# Patient Record
Sex: Female | Born: 1935 | Race: White | Hispanic: No | Marital: Married | Smoking: Never smoker
Health system: Southern US, Community
[De-identification: ages and names within clinical notes are randomized; demographics above are authoritative.]

## PROBLEM LIST (undated history)

## (undated) DIAGNOSIS — IMO0002 Reserved for concepts with insufficient information to code with codable children: Secondary | ICD-10-CM

## (undated) DIAGNOSIS — I10 Essential (primary) hypertension: Secondary | ICD-10-CM

## (undated) DIAGNOSIS — K219 Gastro-esophageal reflux disease without esophagitis: Secondary | ICD-10-CM

## (undated) DIAGNOSIS — I639 Cerebral infarction, unspecified: Secondary | ICD-10-CM

## (undated) DIAGNOSIS — M329 Systemic lupus erythematosus, unspecified: Secondary | ICD-10-CM

## (undated) DIAGNOSIS — G459 Transient cerebral ischemic attack, unspecified: Secondary | ICD-10-CM

## (undated) HISTORY — PX: ESOPHAGOGASTRODUODENOSCOPY: SHX1529

## (undated) HISTORY — PX: APPENDECTOMY: SHX54

---

## 1998-01-29 ENCOUNTER — Other Ambulatory Visit: Admission: RE | Admit: 1998-01-29 | Discharge: 1998-01-29 | Payer: Self-pay | Admitting: Family Medicine

## 1999-03-05 ENCOUNTER — Other Ambulatory Visit: Admission: RE | Admit: 1999-03-05 | Discharge: 1999-03-05 | Payer: Self-pay | Admitting: Family Medicine

## 1999-06-19 ENCOUNTER — Encounter: Payer: Self-pay | Admitting: Family Medicine

## 1999-06-19 ENCOUNTER — Encounter: Admission: RE | Admit: 1999-06-19 | Discharge: 1999-06-19 | Payer: Self-pay | Admitting: Family Medicine

## 2000-06-21 ENCOUNTER — Encounter: Admission: RE | Admit: 2000-06-21 | Discharge: 2000-06-21 | Payer: Self-pay | Admitting: Family Medicine

## 2000-06-21 ENCOUNTER — Encounter: Payer: Self-pay | Admitting: Family Medicine

## 2011-08-30 ENCOUNTER — Encounter (HOSPITAL_BASED_OUTPATIENT_CLINIC_OR_DEPARTMENT_OTHER): Payer: Self-pay

## 2011-08-30 ENCOUNTER — Emergency Department (HOSPITAL_BASED_OUTPATIENT_CLINIC_OR_DEPARTMENT_OTHER)
Admission: EM | Admit: 2011-08-30 | Discharge: 2011-08-30 | Disposition: A | Discharge status: Home / Self Care | Payer: Medicare Other | Attending: Emergency Medicine | Admitting: Emergency Medicine

## 2011-08-30 DIAGNOSIS — M549 Dorsalgia, unspecified: Secondary | ICD-10-CM | POA: Insufficient documentation

## 2011-08-30 DIAGNOSIS — K219 Gastro-esophageal reflux disease without esophagitis: Secondary | ICD-10-CM | POA: Insufficient documentation

## 2011-08-30 DIAGNOSIS — I10 Essential (primary) hypertension: Secondary | ICD-10-CM | POA: Insufficient documentation

## 2011-08-30 DIAGNOSIS — Z79899 Other long term (current) drug therapy: Secondary | ICD-10-CM | POA: Insufficient documentation

## 2011-08-30 HISTORY — DX: Essential (primary) hypertension: I10

## 2011-08-30 HISTORY — DX: Gastro-esophageal reflux disease without esophagitis: K21.9

## 2011-08-30 MED ORDER — ONDANSETRON HCL 4 MG/2ML IJ SOLN
4.0000 mg | Freq: Once | INTRAMUSCULAR | Status: AC
  Administered 2011-08-30: 4 mg via INTRAMUSCULAR
  Filled 2011-08-30: qty 2

## 2011-08-30 MED ORDER — LORAZEPAM 2 MG/ML IJ SOLN
1.0000 mg | Freq: Once | INTRAMUSCULAR | Status: AC
  Administered 2011-08-30: 1 mg via INTRAMUSCULAR
  Filled 2011-08-30: qty 1

## 2011-08-30 MED ORDER — OXYCODONE-ACETAMINOPHEN 5-325 MG PO TABS
2.0000 | ORAL_TABLET | Freq: Once | ORAL | Status: AC
  Administered 2011-08-30: 2 via ORAL
  Filled 2011-08-30: qty 2

## 2011-08-30 MED ORDER — KETOROLAC TROMETHAMINE 60 MG/2ML IM SOLN
60.0000 mg | Freq: Once | INTRAMUSCULAR | Status: AC
  Administered 2011-08-30: 60 mg via INTRAMUSCULAR
  Filled 2011-08-30: qty 2

## 2011-08-30 MED ORDER — HYDROMORPHONE HCL PF 2 MG/ML IJ SOLN
2.0000 mg | Freq: Once | INTRAMUSCULAR | Status: AC
  Administered 2011-08-30: 2 mg via INTRAMUSCULAR
  Filled 2011-08-30: qty 1

## 2011-08-30 MED ORDER — OXYCODONE-ACETAMINOPHEN 5-325 MG PO TABS: 2.0000 | ORAL_TABLET | ORAL | Status: AC | PRN

## 2011-08-30 NOTE — ED Provider Notes (Signed)
History     CSN: [id number]  Arrival date & time 08/30/11  1419   First MD Initiated Contact with Patient 08/30/11 1607      Chief Complaint  Patient presents with  . Tailbone Pain    (Consider location/radiation/quality/duration/timing/severity/associated sxs/prior treatment) Patient is a 77 y.o. female presenting with back pain. The history is provided by the patient. No language interpreter was used.  Back Pain  This is a new problem. The problem occurs constantly. The problem has been gradually worsening. The pain is associated with no known injury. The pain is present in the sacro-iliac joint. The pain is at a severity of 8/10. The pain is moderate. The pain is the same all the time. Stiffness is present all day. She has tried nothing for the symptoms. The treatment provided no relief.  Pt complains of severe pain to tail bone area.  Pt is on nucynta for pain.  Pt recently had a cortisone injection.  Pt has had an MRI in the past. Pt is being seen by neurologist at [facility].  Past Medical History  Diagnosis Date  . Hypertension   . GERD (gastroesophageal reflux disease)     Past Surgical History  Procedure Date  . Appendectomy     History reviewed. No pertinent family history.  History  Substance Use Topics  . Smoking status: Never Smoker   . Smokeless tobacco: Not on file  . Alcohol Use: No    OB History    Grav Para Term Preterm Abortions TAB SAB Ect Mult Living                  Review of Systems  Musculoskeletal: Positive for back pain.  All other systems reviewed and are negative.    Allergies  Plaquenil  Home Medications   Current Outpatient Rx  Name Route Sig Dispense Refill  . BUTALBITAL-APAP-CAFFEINE 50-325-40 MG PO TABS Oral Take 1 tablet by mouth 2 (two) times daily as needed. For headache    . CALCIUM + D PO Oral Take 1 tablet by mouth daily.    Marland Kitchen DICYCLOMINE HCL 20 MG PO TABS Oral Take 20 mg by mouth 4 (four) times daily -   before meals and at bedtime.     Marland Kitchen DIFLUPREDNATE 0.05 % OP EMUL Left Eye Place 1 drop into the left eye 3 (three) times daily.    Marland Kitchen LISINOPRIL 20 MG PO TABS Oral Take 20 mg by mouth daily.    . ADULT MULTIVITAMIN W/MINERALS CH Oral Take 1 tablet by mouth daily.    . OXYCODONE-ACETAMINOPHEN 7.5-325 MG PO TABS Oral Take 1 tablet by mouth every 4 (four) hours as needed. For pain    . PANTOPRAZOLE SODIUM 40 MG PO TBEC Oral Take 40 mg by mouth daily.    Marland Kitchen PROMETHAZINE HCL 25 MG PO TABS Oral Take 25 mg by mouth every 6 (six) hours as needed. For nausea    . SUCRALFATE 1 G PO TABS Oral Take 1 g by mouth at bedtime.     Marland Kitchen TAPENTADOL HCL 75 MG PO TABS Oral Take 2 tablets by mouth every 4 (four) hours as needed. For pain    . ZOLPIDEM TARTRATE 10 MG PO TABS Oral Take 10 mg by mouth at bedtime as needed. For sleep      BP 163/85  Pulse 74  Temp(Src) 97.5 F (36.4 C) (Oral)  Resp 21  Ht 5\' 4"  (1.626 m)  Wt 118 lb (53.524 kg)  BMI  20.25 kg/m2  SpO2 99%  Physical Exam  Nursing note and vitals reviewed. Constitutional: She is oriented to person, place, and time. She appears well-developed and well-nourished.  HENT:  Head: Normocephalic and atraumatic.  Eyes: Conjunctivae are normal. Pupils are equal, round, and reactive to light.  Neck: Normal range of motion. Neck supple.  Pulmonary/Chest: Effort normal.  Abdominal: Soft.  Musculoskeletal: Normal range of motion.  Neurological: She is alert and oriented to person, place, and time. She has normal reflexes.  Skin: Skin is warm.  Psychiatric: She has a normal mood and affect.    ED Course  Procedures (including critical care time)  Labs Reviewed - No data to display No results found.   No diagnosis found.    MDM  Dr. Hyman Hopes in to see and examine.  Pt reports minimal relief with dilaudid.   I gave 2nd injection of ativan and torodol   No relief with second injection.   I offered pt option of admission.  Pt wants to go home.  I gave pt  2 percocet.  Pt reports improved pain/   I advised her to see her Md tomorrow for recheck    Langston Masker, [place] 08/30/11 Lenord Fellers [place], [place] 08/30/11 2127

## 2011-08-30 NOTE — ED Notes (Signed)
Pt states that she had onset of sacral pain last night which became unbearable and she is visibly uncomfortable at triage.  Cannot find a position she is not in pain.  Pt states that

## 2011-08-31 NOTE — ED Provider Notes (Signed)
Medical screening examination/treatment/procedure(s) were conducted as a shared visit with non-physician practitioner(s) and myself.  I personally evaluated the patient during the encounter   Forbes Cellar, MD 08/31/11 [phone]

## 2013-07-07 ENCOUNTER — Emergency Department (HOSPITAL_COMMUNITY): Payer: Medicare Other

## 2013-07-07 ENCOUNTER — Inpatient Hospital Stay (HOSPITAL_COMMUNITY)
Admission: EM | Admit: 2013-07-07 | Discharge: 2013-07-10 | DRG: 066 | Disposition: A | Discharge status: Home / Self Care | Payer: Medicare Other | Attending: Internal Medicine | Admitting: Internal Medicine

## 2013-07-07 ENCOUNTER — Encounter (HOSPITAL_COMMUNITY): Payer: Self-pay | Admitting: Emergency Medicine

## 2013-07-07 DIAGNOSIS — R4789 Other speech disturbances: Secondary | ICD-10-CM | POA: Diagnosis present

## 2013-07-07 DIAGNOSIS — I639 Cerebral infarction, unspecified: Secondary | ICD-10-CM

## 2013-07-07 DIAGNOSIS — G894 Chronic pain syndrome: Secondary | ICD-10-CM | POA: Diagnosis present

## 2013-07-07 DIAGNOSIS — M549 Dorsalgia, unspecified: Secondary | ICD-10-CM | POA: Diagnosis present

## 2013-07-07 DIAGNOSIS — R4781 Slurred speech: Secondary | ICD-10-CM

## 2013-07-07 DIAGNOSIS — W19XXXD Unspecified fall, subsequent encounter: Secondary | ICD-10-CM

## 2013-07-07 DIAGNOSIS — G459 Transient cerebral ischemic attack, unspecified: Secondary | ICD-10-CM | POA: Insufficient documentation

## 2013-07-07 DIAGNOSIS — E876 Hypokalemia: Secondary | ICD-10-CM

## 2013-07-07 DIAGNOSIS — Z7982 Long term (current) use of aspirin: Secondary | ICD-10-CM

## 2013-07-07 DIAGNOSIS — K219 Gastro-esophageal reflux disease without esophagitis: Secondary | ICD-10-CM | POA: Diagnosis present

## 2013-07-07 DIAGNOSIS — Z9181 History of falling: Secondary | ICD-10-CM

## 2013-07-07 DIAGNOSIS — I6350 Cerebral infarction due to unspecified occlusion or stenosis of unspecified cerebral artery: Principal | ICD-10-CM | POA: Diagnosis present

## 2013-07-07 DIAGNOSIS — W19XXXA Unspecified fall, initial encounter: Secondary | ICD-10-CM

## 2013-07-07 DIAGNOSIS — I10 Essential (primary) hypertension: Secondary | ICD-10-CM

## 2013-07-07 DIAGNOSIS — S0990XA Unspecified injury of head, initial encounter: Secondary | ICD-10-CM

## 2013-07-07 LAB — CBC
HCT: 33.8 % — ABNORMAL LOW (ref 36.0–46.0)
Hemoglobin: 11.1 g/dL — ABNORMAL LOW (ref 12.0–15.0)
MCH: 30.7 pg (ref 26.0–34.0)
MCHC: 32.8 g/dL (ref 30.0–36.0)
MCV: 93.6 fL (ref 78.0–100.0)

## 2013-07-07 LAB — URINALYSIS, ROUTINE W REFLEX MICROSCOPIC
Ketones, ur: NEGATIVE mg/dL
Leukocytes, UA: NEGATIVE
Nitrite: NEGATIVE
Protein, ur: NEGATIVE mg/dL
pH: 7 (ref 5.0–8.0)

## 2013-07-07 LAB — COMPREHENSIVE METABOLIC PANEL
Alkaline Phosphatase: 48 U/L (ref 39–117)
BUN: 14 mg/dL (ref 6–23)
Creatinine, Ser: 0.8 mg/dL (ref 0.50–1.10)
GFR calc Af Amer: 81 mL/min — ABNORMAL LOW (ref >90)
Glucose, Bld: 93 mg/dL (ref 70–99)
Potassium: 3.6 mEq/L (ref 3.5–5.1)
Total Protein: 6 g/dL (ref 6.0–8.3)

## 2013-07-07 LAB — PROTIME-INR: INR: 1.01 (ref 0.00–1.49)

## 2013-07-07 LAB — URINE MICROSCOPIC-ADD ON

## 2013-07-07 MED ORDER — BUTALBITAL-APAP-CAFFEINE 50-325-40 MG PO TABS
2.0000 | ORAL_TABLET | Freq: Four times a day (QID) | ORAL | Status: DC | PRN
  Administered 2013-07-07 – 2013-07-10 (×7): 2 via ORAL
  Filled 2013-07-07 (×7): qty 2

## 2013-07-07 MED ORDER — ENOXAPARIN SODIUM 40 MG/0.4ML ~~LOC~~ SOLN
40.0000 mg | SUBCUTANEOUS | Status: DC
  Administered 2013-07-08 – 2013-07-09 (×2): 40 mg via SUBCUTANEOUS
  Filled 2013-07-07 (×3): qty 0.4

## 2013-07-07 MED ORDER — FAMOTIDINE 40 MG PO TABS
40.0000 mg | ORAL_TABLET | Freq: Three times a day (TID) | ORAL | Status: DC
  Administered 2013-07-07 – 2013-07-10 (×9): 40 mg via ORAL
  Filled 2013-07-07 (×10): qty 1

## 2013-07-07 MED ORDER — OXYCODONE-ACETAMINOPHEN 7.5-325 MG PO TABS: 1.0000 | ORAL_TABLET | Freq: Three times a day (TID) | ORAL | Status: DC | PRN

## 2013-07-07 MED ORDER — OXYCODONE HCL 5 MG PO TABS: 2.5000 mg | ORAL_TABLET | Freq: Three times a day (TID) | ORAL | Status: DC | PRN

## 2013-07-07 MED ORDER — ZOLPIDEM TARTRATE 5 MG PO TABS
5.0000 mg | ORAL_TABLET | Freq: Every evening | ORAL | Status: DC | PRN
  Administered 2013-07-07 – 2013-07-09 (×3): 5 mg via ORAL
  Filled 2013-07-07 (×3): qty 1

## 2013-07-07 MED ORDER — OXYCODONE-ACETAMINOPHEN 5-325 MG PO TABS: 1.0000 | ORAL_TABLET | Freq: Three times a day (TID) | ORAL | Status: DC | PRN

## 2013-07-07 MED ORDER — PANTOPRAZOLE SODIUM 40 MG PO TBEC
40.0000 mg | DELAYED_RELEASE_TABLET | Freq: Every day | ORAL | Status: DC
  Administered 2013-07-08 – 2013-07-10 (×3): 40 mg via ORAL
  Filled 2013-07-07 (×2): qty 1

## 2013-07-07 MED ORDER — ASPIRIN 325 MG PO TABS
325.0000 mg | ORAL_TABLET | Freq: Every day | ORAL | Status: DC
  Administered 2013-07-08: 325 mg via ORAL
  Filled 2013-07-07 (×2): qty 1

## 2013-07-07 MED ORDER — LISINOPRIL 20 MG PO TABS
20.0000 mg | ORAL_TABLET | Freq: Every day | ORAL | Status: DC
  Administered 2013-07-08 – 2013-07-10 (×3): 20 mg via ORAL
  Filled 2013-07-07 (×3): qty 1

## 2013-07-07 NOTE — ED Provider Notes (Signed)
TIME SEEN: 7:00 PM  CHIEF COMPLAINT: Fall, head injury, slurred speech and unsteady gait  HPI: Patient is a 78 year old female with a history of hypertension, prior TIA who presents to the emergency department after a fall this morning. Patient reports that she was walking out of the driveway went to get the newspaper when she bent over and fell striking her head. She had no loss of consciousness. She was able to get up immediately back into the house. Her husband reports that she was drowsy for the rest of the day taking multiple naps. When he woke her up this afternoon at 4 PM he noticed that she had slurred speech, difficulty with word finding and then had unsteady gait. This has improved. She is denying any headache, neck pain. She denies any numbness or focal weakness. She's not on anticoagulation other than aspirin. She denies any chest pain, shortness breath, palpitations or dizziness that led to her fall. No recent vomiting or diarrhea. No fever or cough. No dysuria or hematuria.  ROS: See HPI Constitutional: no fever  Eyes: no drainage  ENT: no runny nose   Cardiovascular:  no chest pain  Resp: no SOB  GI: no vomiting GU: no dysuria Integumentary: no rash  Allergy: no hives  Musculoskeletal: no leg swelling  Neurological: no slurred speech ROS otherwise negative  PAST MEDICAL HISTORY/PAST SURGICAL HISTORY:  Past Medical History  Diagnosis Date  . Hypertension   . GERD (gastroesophageal reflux disease)     MEDICATIONS:  Prior to Admission medications   Medication Sig Start Date End Date Taking? Authorizing Provider  aspirin 325 MG tablet Take 325 mg by mouth daily.   Yes Historical Provider, MD  butalbital-acetaminophen-caffeine (FIORICET, ESGIC) 50-325-40 MG per tablet Take 2 tablets by mouth every 6 (six) hours as needed for headache. For headache   Yes Historical Provider, MD  famotidine (PEPCID) 40 MG tablet Take 40 mg by mouth 3 (three) times daily.   Yes Historical  Provider, MD  lisinopril (PRINIVIL,ZESTRIL) 20 MG tablet Take 20 mg by mouth daily.   Yes Historical Provider, MD  oxyCODONE-acetaminophen (PERCOCET) 7.5-325 MG per tablet Take 1 tablet by mouth every 6 (six) hours as needed for pain. For pain   Yes Historical Provider, MD    ALLERGIES:  Allergies  Allergen Reactions  . Plaquenil [Hydroxychloroquine Sulfate] Rash    SOCIAL HISTORY:  History  Substance Use Topics  . Smoking status: Never Smoker   . Smokeless tobacco: Not on file  . Alcohol Use: No    FAMILY HISTORY: No family history on file.  EXAM: BP 161/72  Temp(Src) 98 F (36.7 C) (Oral)  Resp 18  Ht 5\' 3"  (1.6 m)  Wt 118 lb (53.524 kg)  BMI 20.91 kg/m2  SpO2 98% CONSTITUTIONAL: Alert and oriented x3 and responds appropriately to questions. Well-appearing; well-nourished; GCS 15 HEAD: Normocephalic; atraumatic EYES: Conjunctivae clear, PERRL, EOMI ENT: normal nose; no rhinorrhea; moist mucous membranes; pharynx without lesions noted; no dental injury; no septal hematoma NECK: Supple, no meningismus, no LAD; no midline spinal tenderness, step-off or deformity CARD: RRR; S1 and S2 appreciated; no murmurs, no clicks, no rubs, no gallops RESP: Normal chest excursion without splinting or tachypnea; breath sounds clear and equal bilaterally; no wheezes, no rhonchi, no rales; chest wall stable, nontender to palpation ABD/GI: Normal bowel sounds; non-distended; soft, non-tender, no rebound, no guarding PELVIS:  stable, nontender to palpation; full range of motion in bilateral hips with no pain with internal or external rotation,  no leg length discrepancy BACK:  The back appears normal and is non-tender to palpation, there is no CVA tenderness; no midline spinal tenderness, step-off or deformity EXT: Normal ROM in all joints; non-tender to palpation; no edema; normal capillary refill; no cyanosis    SKIN: Normal color for age and race; warm NEURO: Moves all extremities equally; ,  strength 5/5 in all 4 extremity, cranial nerves II through XII intact, sensation to light touch intact diffusely, no pronator drift PSYCH: The patient's mood and manner are appropriate. Grooming and personal hygiene are appropriate.  MEDICAL DECISION MAKING: Patient here with possible mechanical fall with head injury. She had some neurologic deficits have improved. Concern for possible intracranial hemorrhage versus TIA versus concussion. Will check labs, EKG, CT head and cervical spine., Urinalysis. Have recommended admission given her prior history of TIA for observation. Patient and family agree with this plan. They report their physician is Dr. Lendon Colonel with [facility] in [facility].  ED PROGRESS: Patient's labs are unremarkable other than a mild leukocytosis. Urine does show trace hemoglobin but no other sign of infection. Head and neck CT showed no acute abnormality. Patient is still neurologically intact and hemodynamically stable. Discussed with hospitalist, Dr. Allena Katz, for admission to telemetry, observation for TIA workup.    EKG Interpretation    Date/Time:  Friday July 07 2013 18:28:36 EST Ventricular Rate:  61 PR Interval:  150 QRS Duration: 74 QT Interval:  422 QTC Calculation: 425 R Axis:   -7 Text Interpretation:  Sinus rhythm Abnormal R-wave progression, early transition No old tracing to compare Confirmed by Joandry Slagter  DO, Shlok Raz ([id number]) on 07/07/2013 7:25:53 PM             Kim Maw Yuli Lanigan, DO 07/07/13 2143

## 2013-07-07 NOTE — ED Notes (Signed)
Patient transported to X-ray 

## 2013-07-07 NOTE — H&P (Signed)
[facility] History and Physical  Patient: Kim Blevins  FAO:[id number]  DOB: Jan 09, 1935  DOS: the patient was seen and examined on 07/07/2013 PCP: Toniann Fail, MD  Chief Complaint: Fall  HPI: Kim Blevins is a 78 y.o. female with Past medical history of hypertension, GERD, chronic pain, recurrent fall. The patient is coming from home. The history is obtained from old patient as well as her husband. Today around 10 AM the patient was in house and she had a fall. It was not witnessed by her husband heard a thud and when he came to the living room he found her on the ground close to her chair. He was able to stand her up and she was not confused but after lunch she wanted to sleep and was not waking up until 4pm, as her husband woke her up. At that time she was having slurred speech, she was confused and not oriented to time or place. She was able to identify her husband. And therefore he brought her to the hospital. She has been having recurrent falls with her last fall that required admission to the hospital was in September. They have recently adjusted her medication lisinopril from 30 mg to 20 mg daily. The patient does not remember the events of the fall. She mentions that since she woke up in the morning she has been having grogginess. She denies any fever or chills or burning urination or diarrhea or constipation or nausea or vomiting. She has not taken Ambien yesterday.  Review of Systems: as mentioned in the history of present illness.  A Comprehensive review of the other systems is negative.  Past Medical History  Diagnosis Date  . Hypertension   . GERD (gastroesophageal reflux disease)    Past Surgical History  Procedure Laterality Date  . Appendectomy     Social History:  reports that she has never smoked. She does not have any smokeless tobacco history on file. She reports that she does not drink alcohol. Her drug history is not on file. Independent for most of her   ADL.  Allergies  Allergen Reactions  . Plaquenil [Hydroxychloroquine Sulfate] Rash    No family history on file.  Prior to Admission medications   Medication Sig Start Date End Date Taking? Authorizing Provider  aspirin 325 MG tablet Take 325 mg by mouth daily.   Yes Historical Provider, MD  butalbital-acetaminophen-caffeine (FIORICET, ESGIC) 50-325-40 MG per tablet Take 2 tablets by mouth every 6 (six) hours as needed for headache. For headache   Yes Historical Provider, MD  famotidine (PEPCID) 40 MG tablet Take 40 mg by mouth 3 (three) times daily.   Yes Historical Provider, MD  lisinopril (PRINIVIL,ZESTRIL) 20 MG tablet Take 20 mg by mouth daily.   Yes Historical Provider, MD  omeprazole (PRILOSEC) 20 MG capsule Take 20 mg by mouth daily.   Yes Historical Provider, MD  oxyCODONE-acetaminophen (PERCOCET) 7.5-325 MG per tablet Take 1 tablet by mouth every 6 (six) hours as needed for pain. For pain   Yes Historical Provider, MD  zolpidem (AMBIEN) 10 MG tablet Take 10 mg by mouth at bedtime as needed for sleep.   Yes Historical Provider, MD    Physical Exam: Filed Vitals:   07/07/13 1830 07/07/13 2019  BP: 161/72 163/66  Temp: 98 F (36.7 C)   TempSrc: Oral   Resp: 18 18  Height: 5\' 3"  (1.6 m)   Weight: 53.524 kg (118 lb)   SpO2: 98% 98%  General: Alert, Awake and Oriented to Time, Place and Person. Appear in mild distress Eyes: PERRL ENT: Oral Mucosa clear moist. Neck: no JVD Cardiovascular: S1 and S2 Present, no Murmur, Peripheral Pulses Present Respiratory: Bilateral Air entry equal and Decreased, Clear to Auscultation,  no Crackles,no wheezes Abdomen: Bowel Sound Present, Soft and Non tender Skin: no Rash Extremities: no Pedal edema, no calf tenderness Neurologic: Mental status alert awake and oriented to time person and person normal speech normal attention, Cranial Nerves pupils are reactive good cough, Motor strength bilaterally), Sensation intact to light touch,  reflexes intact, babinski negative, Proprioception normal, Cerebellar test normal finger-nose-finger.  Labs on Admission:  CBC:  Recent Labs Lab 07/07/13 1906  WBC 12.6*  HGB 11.1*  HCT 33.8*  MCV 93.6  PLT 204    CMP     Component Value Date/Time   NA 136 07/07/2013 1906   K 3.6 07/07/2013 1906   CL 103 07/07/2013 1906   CO2 24 07/07/2013 1906   GLUCOSE 93 07/07/2013 1906   BUN 14 07/07/2013 1906   CREATININE 0.80 07/07/2013 1906   CALCIUM 8.6 07/07/2013 1906   PROT 6.0 07/07/2013 1906   ALBUMIN 3.3* 07/07/2013 1906   AST 15 07/07/2013 1906   ALT 12 07/07/2013 1906   ALKPHOS 48 07/07/2013 1906   BILITOT 0.3 07/07/2013 1906   GFRNONAA 70* 07/07/2013 1906   GFRAA 81* 07/07/2013 1906    No results found for this basename: LIPASE, AMYLASE,  in the last 168 hours No results found for this basename: AMMONIA,  in the last 168 hours   Recent Labs Lab 07/07/13 1906  TROPONINI <0.30   BNP (last 3 results) No results found for this basename: PROBNP,  in the last 8760 hours  Radiological Exams on Admission: Dg Chest 2 View  07/07/2013   CLINICAL DATA:  Dizziness.  History of fall.  EXAM: CHEST  2 VIEW  COMPARISON:  Chest x-ray 04/13/2013.  FINDINGS: There are some irregular opacity is in the region of the left lower lobe, which could represent a developing bronchopneumonia or sequela of mild aspiration. No pleural effusions. No evidence of pulmonary edema. No pneumothorax. Heart size is normal. Mediastinal contours are within normal limits. Atherosclerosis in the thoracic aorta.  IMPRESSION: 1. Irregular opacities in the left lower lobe may reflect developing bronchopneumonia or sequela of mild aspiration. 2. No definite findings to suggest significant acute traumatic injury to the thorax on today's plain film examination.   Electronically Signed   By: Trudie Reed M.D.   On: 07/07/2013 20:29   Ct Head Wo Contrast  07/07/2013   CLINICAL DATA:  78 year old female with  head and neck pain following fall.  EXAM: CT HEAD WITHOUT CONTRAST  CT CERVICAL SPINE WITHOUT CONTRAST  TECHNIQUE: Multidetector CT imaging of the head and cervical spine was performed following the standard protocol without intravenous contrast. Multiplanar CT image reconstructions of the cervical spine were also generated.  COMPARISON:  04/13/2013 CTs  FINDINGS: CT HEAD FINDINGS  Mild atrophy and moderate chronic small-vessel white matter ischemic changes are again noted.  No acute intracranial abnormalities are identified, including mass lesion or mass effect, hydrocephalus, extra-axial fluid collection, midline shift, hemorrhage, or acute infarction. The visualized bony calvarium is unremarkable.  CT CERVICAL SPINE FINDINGS  Normal alignment is noted.  There is no evidence of acute fracture, subluxation or prevertebral soft tissue swelling.  Mild degenerative disc disease and spondylosis is C5-C6 and C6-7 noted.  No focal bony lesions are  present.  The soft tissue structures are within normal limits.  IMPRESSION: No evidence of acute intracranial abnormality.  No static evidence of acute injury to the cervical spine.  Atrophy and chronic small-vessel white matter ischemic changes.  Mild degenerative disc disease and spondylosis from C5-C7.   Electronically Signed   By: Laveda Abbe M.D.   On: 07/07/2013 21:15   Ct Cervical Spine Wo Contrast  07/07/2013   CLINICAL DATA:  78 year old female with head and neck pain following fall.  EXAM: CT HEAD WITHOUT CONTRAST  CT CERVICAL SPINE WITHOUT CONTRAST  TECHNIQUE: Multidetector CT imaging of the head and cervical spine was performed following the standard protocol without intravenous contrast. Multiplanar CT image reconstructions of the cervical spine were also generated.  COMPARISON:  04/13/2013 CTs  FINDINGS: CT HEAD FINDINGS  Mild atrophy and moderate chronic small-vessel white matter ischemic changes are again noted.  No acute intracranial abnormalities are  identified, including mass lesion or mass effect, hydrocephalus, extra-axial fluid collection, midline shift, hemorrhage, or acute infarction. The visualized bony calvarium is unremarkable.  CT CERVICAL SPINE FINDINGS  Normal alignment is noted.  There is no evidence of acute fracture, subluxation or prevertebral soft tissue swelling.  Mild degenerative disc disease and spondylosis is C5-C6 and C6-7 noted.  No focal bony lesions are present.  The soft tissue structures are within normal limits.  IMPRESSION: No evidence of acute intracranial abnormality.  No static evidence of acute injury to the cervical spine.  Atrophy and chronic small-vessel white matter ischemic changes.  Mild degenerative disc disease and spondylosis from C5-C7.   Electronically Signed   By: Laveda Abbe M.D.   On: 07/07/2013 21:15    EKG: Independently reviewed. normal sinus rhythm, nonspecific ST and T waves changes.  Assessment/Plan Principal Problem:   Slurred speech Active Problems:   Fall   Hypertension   Chronic pain syndrome   1. Slurred speech The patient is presenting with complaints of slurred speech with confusion. Her acute encephalopathy is likely secondary to a fall leading to concussion. Although the possibility of TIA cannot be ruled out. At present she does not appear to have any neurological deficit. She has an initial CT scan that is negative for any acute ischemia. She will be admitted for observation under telemetry, serial neuro checks, echocardiogram and carotid artery Doppler in the morning, MRI will be done. Along with that we will also work with physical therapy to improve her gait and fall awareness. Currently with negative neurological examination is difficult to identify the cause of the fall, I will check orthostatic blood pressures.  2. Hypertension The patient is on lisinopril Her blood pressure currently appears stable I would continue her medication  3. Chronic pain Would continue her  on oxy as needed   DVT Prophylaxis: subcutaneous Heparin Nutrition: Cardiac diet  Code Status: Full  Family Communication: Husband was present at bedside, opportunity was given to ask question and all questions were answered satisfactorily at the time of interview. Disposition: Admitted to observation in telemetry unit.  Author: Lynden Oxford, MD [facility] Pager: [phone] 07/07/2013, 10:16 PM    If 7PM-7AM, please contact night-coverage [email or web address] Password [email or web address]

## 2013-07-07 NOTE — ED Notes (Addendum)
Pt to ED via [facility] from home.  Pt fell from a standing position this morning and hit head on hardwood floor, hematoma noted to right side of forehead from fall.  Pt husband noticed pt developed slurred speech around 4pm, speech had resolved and was clear upon EMS arrival.  Neuro exam negative upon arrival to ED.  Pt reports she has had a productive cough for the past 3 days yellow in color.  Also states she has been having "difficulty walking"- denies headaches or dizziness.  IV in place.

## 2013-07-08 ENCOUNTER — Observation Stay (HOSPITAL_COMMUNITY): Payer: Medicare Other

## 2013-07-08 DIAGNOSIS — R4781 Slurred speech: Secondary | ICD-10-CM | POA: Insufficient documentation

## 2013-07-08 DIAGNOSIS — G894 Chronic pain syndrome: Secondary | ICD-10-CM | POA: Diagnosis present

## 2013-07-08 DIAGNOSIS — G459 Transient cerebral ischemic attack, unspecified: Secondary | ICD-10-CM

## 2013-07-08 DIAGNOSIS — I10 Essential (primary) hypertension: Secondary | ICD-10-CM | POA: Diagnosis present

## 2013-07-08 DIAGNOSIS — I517 Cardiomegaly: Secondary | ICD-10-CM

## 2013-07-08 DIAGNOSIS — W19XXXA Unspecified fall, initial encounter: Secondary | ICD-10-CM

## 2013-07-08 DIAGNOSIS — E876 Hypokalemia: Secondary | ICD-10-CM | POA: Diagnosis not present

## 2013-07-08 LAB — TROPONIN I: Troponin I: <0.3 ng/mL (ref <0.30)

## 2013-07-08 LAB — LIPID PANEL
HDL: 68 mg/dL (ref >39)
LDL Cholesterol: 94 mg/dL (ref 0–99)
Total CHOL/HDL Ratio: 2.6 RATIO
Triglycerides: 62 mg/dL (ref <150)
VLDL: 12 mg/dL (ref 0–40)

## 2013-07-08 LAB — URINE MICROSCOPIC-ADD ON

## 2013-07-08 LAB — COMPREHENSIVE METABOLIC PANEL
ALT: 13 U/L (ref 0–35)
Albumin: 3.4 g/dL — ABNORMAL LOW (ref 3.5–5.2)
Alkaline Phosphatase: 51 U/L (ref 39–117)
BUN: 11 mg/dL (ref 6–23)
CO2: 22 mEq/L (ref 19–32)
Creatinine, Ser: 0.79 mg/dL (ref 0.50–1.10)
GFR calc Af Amer: >90 mL/min (ref >90)
GFR calc non Af Amer: 79 mL/min — ABNORMAL LOW (ref >90)
Glucose, Bld: 88 mg/dL (ref 70–99)
Sodium: 138 mEq/L (ref 135–145)
Total Protein: 6.3 g/dL (ref 6.0–8.3)

## 2013-07-08 LAB — CBC WITH DIFFERENTIAL/PLATELET
Basophils Relative: 1 % (ref 0–1)
Eosinophils Absolute: 0.3 10*3/uL (ref 0.0–0.7)
Eosinophils Relative: 3 % (ref 0–5)
HCT: 34.6 % — ABNORMAL LOW (ref 36.0–46.0)
Hemoglobin: 11.9 g/dL — ABNORMAL LOW (ref 12.0–15.0)
Lymphs Abs: 2.9 10*3/uL (ref 0.7–4.0)
MCH: 31.3 pg (ref 26.0–34.0)
MCHC: 34.4 g/dL (ref 30.0–36.0)
MCV: 91.1 fL (ref 78.0–100.0)
Monocytes Absolute: 1.2 10*3/uL — ABNORMAL HIGH (ref 0.1–1.0)
Monocytes Relative: 13 % — ABNORMAL HIGH (ref 3–12)
Neutrophils Relative %: 54 % (ref 43–77)

## 2013-07-08 LAB — URINALYSIS, ROUTINE W REFLEX MICROSCOPIC
Glucose, UA: NEGATIVE mg/dL
Ketones, ur: 15 mg/dL — AB
Nitrite: NEGATIVE
pH: 5.5 (ref 5.0–8.0)

## 2013-07-08 LAB — GLUCOSE, CAPILLARY: Glucose-Capillary: 88 mg/dL (ref 70–99)

## 2013-07-08 LAB — HEMOGLOBIN A1C: Hgb A1c MFr Bld: 5.5 % (ref <5.7)

## 2013-07-08 MED ORDER — KETOROLAC TROMETHAMINE 15 MG/ML IJ SOLN
15.0000 mg | Freq: Once | INTRAMUSCULAR | Status: AC
  Administered 2013-07-08: 15 mg via INTRAVENOUS
  Filled 2013-07-08: qty 1

## 2013-07-08 NOTE — Evaluation (Signed)
Occupational Therapy Evaluation Patient Details Name: PAITEN BOIES MRN: [id number] DOB: [birth year] Today's Date: 07/08/2013 Time: 1020-1040 OT Time Calculation (min): 20 min  OT Assessment / Plan / Recommendation History of present illness Today around 10 AM the patient was in house and she had a fall. It was not witnessed by her husband heard a thud and when he came to the living room he found her on the ground close to her chair. He was able to stand her up and she was not confused but after lunch she wanted to sleep and was not waking up until 4pm, as her husband woke her up. At that time she was having slurred speech, she was confused and not oriented to time or place. She was able to identify her husband. And therefore he brought her to the hospital.   Clinical Impression   PT admitted with s/p fall. Pt currently with functional limitiations due to the deficits listed below (see OT problem list).  Pt will benefit from skilled OT to increase their independence and safety with adls and balance to allow discharge HHOT. Ot to follow for balance with ADLS     OT Assessment  Patient needs continued OT Services    Follow Up Recommendations  Home health OT;Supervision - Intermittent    Barriers to Discharge      Equipment Recommendations  None recommended by OT    Recommendations for Other Services    Frequency  Min 2X/week    Precautions / Restrictions Precautions Precautions: Fall   Pertinent Vitals/Pain Fall risk Orthostatics in vital section    ADL  Eating/Feeding: Independent Where Assessed - Eating/Feeding: Chair Grooming: Wash/dry hands;Wash/dry face;Teeth care;Supervision/safety Where Assessed - Grooming: Unsupported standing Toilet Transfer: Radiographer, therapeutic Method: Sit to Barista: Regular height toilet Toileting - Clothing Manipulation and Hygiene: Supervision/safety Where Assessed - Toileting Clothing Manipulation and  Hygiene: Sit to stand from 3-in-1 or toilet (cues to pull up underwear prior to attempting to walk) Transfers/Ambulation Related to ADLs: Pt ambulating with decr speed and guarded. pt reaching outside base of support with BIL UE for door frames and therapist ADL Comments: Pt opening all adl containers without (A). Pt able to turn her head and discuss family while ambulating. pt complete bed mobility without (A).     OT Diagnosis: Generalized weakness  OT Problem List: Decreased strength;Decreased activity tolerance;Impaired balance (sitting and/or standing);Decreased safety awareness;Decreased knowledge of use of DME or AE;Decreased knowledge of precautions;Pain OT Treatment Interventions: Self-care/ADL training;Therapeutic exercise;DME and/or AE instruction;Therapeutic activities;Patient/family education;Balance training   OT Goals(Current goals can be found in the care plan section) Acute Rehab OT Goals Patient Stated Goal: to go home to husband OT Goal Formulation: With patient Time For Goal Achievement: 07/22/13 Potential to Achieve Goals: Good  Visit Information  Last OT Received On: 07/08/13 Assistance Needed: +1 History of Present Illness: Today around 10 AM the patient was in house and she had a fall. It was not witnessed by her husband heard a thud and when he came to the living room he found her on the ground close to her chair. He was able to stand her up and she was not confused but after lunch she wanted to sleep and was not waking up until 4pm, as her husband woke her up. At that time she was having slurred speech, she was confused and not oriented to time or place. She was able to identify her husband. And therefore he brought her to the hospital.  Prior Functioning     Home Living Family/patient expects to be discharged to:: Private residence Living Arrangements: Spouse/significant other Available Help at Discharge: Family;Available 24 hours/day Type of Home:  House Home Access: Stairs to enter [employer] of Steps: 7 Entrance Stairs-Rails: Can reach both Home Layout: Two level;Able to live on main level with bedroom/bathroom Home Equipment: None Additional Comments: shower seat, walk in shower, standard toilets Prior Function Level of Independence: Independent Communication Communication: No difficulties Dominant Hand: Right         Vision/Perception Vision - History Baseline Vision: No visual deficits Patient Visual Report: No change from baseline   Cognition  Cognition Arousal/Alertness: Awake/alert Behavior During Therapy: WFL for tasks assessed/performed Overall Cognitive Status: Within Functional Limits for tasks assessed    Extremity/Trunk Assessment Upper Extremity Assessment Upper Extremity Assessment: Overall WFL for tasks assessed Lower Extremity Assessment Lower Extremity Assessment: Defer to PT evaluation Cervical / Trunk Assessment Cervical / Trunk Assessment: Normal     Mobility Bed Mobility Bed Mobility: Supine to Sit;Sitting - Scoot to Edge of Bed Supine to Sit: 5: Supervision Sitting - Scoot to [place] of Bed: 4: Min guard Details for Bed Mobility Assistance: VCs to scoot to EOB Transfers Sit to Stand: 4: Min guard Stand to Sit: 4: Min guard Details for Transfer Assistance: Some initial instability noted with standing, able to self corrent.     Exercise     Balance Static Standing Balance Static Standing - Balance Support: No upper extremity supported Static Standing - Level of Assistance: 5: Stand by assistance Static Standing - Comment/# of Minutes: ~3 mins   End of Session OT - End of Session Activity Tolerance: Patient tolerated treatment well Patient left: in chair;with call bell/phone within reach Nurse Communication: Mobility status;Precautions  GO     Harolyn Rutherford 07/08/2013, 10:40 AM Pager: [phone]

## 2013-07-08 NOTE — Progress Notes (Signed)
Kim Blevins made aware of MRI results.

## 2013-07-08 NOTE — Evaluation (Addendum)
Physical Therapy Evaluation Patient Details Name: Kim Blevins MRN: [id number] DOB: 20-Mar-1935 Today's Date: 07/08/2013 Time: 2130-8657 PT Time Calculation (min): 39 min  PT Assessment / Plan / Recommendation History of Present Illness  Today around 10 AM the patient was in house and she had a fall. It was not witnessed by her husband heard a thud and when he came to the living room he found her on the ground close to her chair. He was able to stand her up and she was not confused but after lunch she wanted to sleep and was not waking up until 4pm, as her husband woke her up. At that time she was having slurred speech, she was confused and not oriented to time or place. She was able to identify her husband. And therefore he brought her to the hospital.  Clinical Impression  Patient demonstrates deficits in functional mobility as indicated below. Patient will benefit from continued skilled PT to address deficits and maximize function. Will continue to see as indicated and progress as tolerated. Given frequent falls and noted instability recommend HHPT evaluation upon discharge for balance and safety.    PT Assessment  Patient needs continued PT services    Follow Up Recommendations  Home health PT;Supervision - Intermittent          Equipment Recommendations  None recommended by PT    Recommendations for Other Services     Frequency Min 4X/week    Precautions / Restrictions Precautions Precautions: Fall   Pertinent Vitals/Pain 8/10 back pain reported (nsg aware) patient also with SpO2 on 82% during ambulation on room air Orthostatic neg, BPs assessed and charted under vitals      Mobility  Bed Mobility Bed Mobility: Supine to Sit;Sitting - Scoot to Edge of Bed Supine to Sit: 5: Supervision Sitting - Scoot to [place] of Bed: 4: Min guard Details for Bed Mobility Assistance: VCs to scoot to EOB Transfers Transfers: Sit to Stand;Stand to Sit Sit to Stand: 4: Min guard Stand to  Sit: 4: Min guard Details for Transfer Assistance: Some initial instability noted with standing, able to self corrent. Ambulation/Gait Ambulation/Gait Assistance: 5: Supervision;4: Min guard Ambulation Distance (Feet): 620 Feet Assistive device: None Ambulation/Gait Assistance Details: some mild instability noted Gait Pattern: Step-through pattern;Decreased stride length;Narrow base of support Gait velocity: decreased General Gait Details: some instability noted Stairs: Yes Stairs Assistance: 5: Supervision;4: Min guard Stair Management Technique: One rail Right;Step to pattern;Forwards Number of Stairs: 5 Modified Rankin (Stroke Patients Only) Pre-Morbid Rankin Score: No significant disability Modified Rankin: Slight disability    Exercises     PT Diagnosis: Abnormality of gait;Generalized weakness  PT Problem List: Decreased strength;Decreased activity tolerance;Decreased balance;Decreased mobility PT Treatment Interventions: Gait training;Stair training;Functional mobility training;Therapeutic activities;Therapeutic exercise;Balance training;Patient/family education     PT Goals(Current goals can be found in the care plan section) Acute Rehab PT Goals Patient Stated Goal: to go home to husband PT Goal Formulation: With patient Time For Goal Achievement: 07/22/13 Potential to Achieve Goals: Fair  Visit Information  Last PT Received On: 07/08/13 Assistance Needed: +1 History of Present Illness: Today around 10 AM the patient was in house and she had a fall. It was not witnessed by her husband heard a thud and when he came to the living room he found her on the ground close to her chair. He was able to stand her up and she was not confused but after lunch she wanted to sleep and was not waking up until  4pm, as her husband woke her up. At that time she was having slurred speech, she was confused and not oriented to time or place. She was able to identify her husband. And therefore  he brought her to the hospital.       Prior Functioning  Home Living Family/patient expects to be discharged to:: Private residence Living Arrangements: Spouse/significant other Available Help at Discharge: Family;Available 24 hours/day Type of Home: House Home Access: Stairs to enter [employer] of Steps: 7 Entrance Stairs-Rails: Can reach both Home Layout: Two level;Able to live on main level with bedroom/bathroom Home Equipment: None Additional Comments: shower seat, walk in shower, standard toilets Prior Function Level of Independence: Independent Communication Communication: No difficulties Dominant Hand: Right    Cognition  Cognition Arousal/Alertness: Awake/alert Behavior During Therapy: WFL for tasks assessed/performed Overall Cognitive Status: Within Functional Limits for tasks assessed    Extremity/Trunk Assessment Upper Extremity Assessment Upper Extremity Assessment: Overall WFL for tasks assessed Lower Extremity Assessment Lower Extremity Assessment: Generalized weakness Cervical / Trunk Assessment Cervical / Trunk Assessment: Normal   Balance Balance Balance Assessed: Yes Static Standing Balance Static Standing - Balance Support: No upper extremity supported Static Standing - Level of Assistance: 5: Stand by assistance Static Standing - Comment/# of Minutes: ~3 mins Standardized Balance Assessment Standardized Balance Assessment: Dynamic Gait Index Dynamic Gait Index Level Surface: Normal Change in Gait Speed: Normal Gait with Horizontal Head Turns: Mild Impairment Gait with Vertical Head Turns: Mild Impairment Gait and Pivot Turn: Mild Impairment Step Over Obstacle: Normal Step Around Obstacles: Mild Impairment Steps: Normal Total Score: 20  End of Session PT - End of Session Equipment Utilized During Treatment: Gait belt  GP Functional Assessment Tool Used: DGI Functional Limitation: Mobility: Walking and moving around Mobility:  Walking and Moving Around Current Status (Z6109): At least 20 percent but less than 40 percent impaired, limited or restricted Mobility: Walking and Moving Around Goal Status [phone]): At least 1 percent but less than 20 percent impaired, limited or restricted   Fabio Asa 07/08/2013, 10:51 AM Charlotte Crumb, PT DPT  [phone]

## 2013-07-08 NOTE — Progress Notes (Addendum)
[facility] PROGRESS NOTE  Kim Blevins ZOX:[id number] DOB: Feb 06, 1935 DOA: 07/07/2013 PCP: Toniann Fail, MD  Assessment/Plan: Syncope Monitor on telemetry. Patient had episode of syncope with slurred speech and some confusion. No neurological deficit on exam. Head CT negative. MRI brain pending. Follow 2-D echo and carotid Dopplers. -Orthostasis negative. -Seen by PT eval and recommended home with home health.  Hypertension Continue lisinopril  Chronic back pain Continue oxycodone  Hypokalemia replete    Diet: Cardiac  DVT prophylaxis: Subcutaneous heparin  Code Status: Full code Family Communication: None at bedside Disposition Plan: Home with home health once workup completed likely tomorrow   Consultants:  none  Procedures:  None  Antibiotics:  None  HPI/Subjective: Admission H&P reviewed. Patient denies any symptoms.  Objective: Filed Vitals:   07/08/13 1401  BP: 147/54  Pulse: 63  Temp: 97.9 F (36.6 C)  Resp: 18    Intake/Output Summary (Last 24 hours) at 07/08/13 1412 Last data filed at 07/08/13 1257  Gross per 24 hour  Intake    540 ml  Output      0 ml  Net    540 ml   Filed Weights   07/07/13 1830 07/07/13 2338  Weight: 53.524 kg (118 lb) 52.7 kg (116 lb 2.9 oz)    Exam:   General:  Elderly thin built female in no acute distress  HEENT: No pallor, moist oral mucosa  Chest: Clear to auscultation bilaterally, no added sounds  CVS: Normal S1-S2, no murmurs rub or gallop  Abdomen: Soft, nontender, bowel sounds present  Extremities: Warm, no edema  CNS: AAO x3, nonfocal    Data Reviewed: Basic Metabolic Panel:  Recent Labs Lab 07/07/13 1906 07/08/13 0555  NA 136 138  K 3.6 3.3*  CL 103 104  CO2 24 22  GLUCOSE 93 88  BUN 14 11  CREATININE 0.80 0.79  CALCIUM 8.6 8.9   Liver Function Tests:  Recent Labs Lab 07/07/13 1906 07/08/13 0555  AST 15 14  ALT 12 13  ALKPHOS 48 51  BILITOT 0.3 0.5  PROT  6.0 6.3  ALBUMIN 3.3* 3.4*   No results found for this basename: LIPASE, AMYLASE,  in the last 168 hours No results found for this basename: AMMONIA,  in the last 168 hours CBC:  Recent Labs Lab 07/07/13 1906 07/08/13 0555  WBC 12.6* 9.7  NEUTROABS  --  5.3  HGB 11.1* 11.9*  HCT 33.8* 34.6*  MCV 93.6 91.1  PLT 204 227   Cardiac Enzymes:  Recent Labs Lab 07/07/13 1906 07/08/13 0555  TROPONINI <0.30 <0.30   BNP (last 3 results) No results found for this basename: PROBNP,  in the last 8760 hours CBG:  Recent Labs Lab 07/08/13 0643  GLUCAP 88    No results found for this or any previous visit (from the past 240 hour(s)).   Studies: Dg Chest 2 View  07/07/2013   CLINICAL DATA:  Dizziness.  History of fall.  EXAM: CHEST  2 VIEW  COMPARISON:  Chest x-ray 04/13/2013.  FINDINGS: There are some irregular opacity is in the region of the left lower lobe, which could represent a developing bronchopneumonia or sequela of mild aspiration. No pleural effusions. No evidence of pulmonary edema. No pneumothorax. Heart size is normal. Mediastinal contours are within normal limits. Atherosclerosis in the thoracic aorta.  IMPRESSION: 1. Irregular opacities in the left lower lobe may reflect developing bronchopneumonia or sequela of mild aspiration. 2. No definite findings to suggest significant acute traumatic  injury to the thorax on today's plain film examination.   Electronically Signed   By: Trudie Reed M.D.   On: 07/07/2013 20:29   Ct Head Wo Contrast  07/07/2013   CLINICAL DATA:  78 year old female with head and neck pain following fall.  EXAM: CT HEAD WITHOUT CONTRAST  CT CERVICAL SPINE WITHOUT CONTRAST  TECHNIQUE: Multidetector CT imaging of the head and cervical spine was performed following the standard protocol without intravenous contrast. Multiplanar CT image reconstructions of the cervical spine were also generated.  COMPARISON:  04/13/2013 CTs  FINDINGS: CT HEAD FINDINGS   Mild atrophy and moderate chronic small-vessel white matter ischemic changes are again noted.  No acute intracranial abnormalities are identified, including mass lesion or mass effect, hydrocephalus, extra-axial fluid collection, midline shift, hemorrhage, or acute infarction. The visualized bony calvarium is unremarkable.  CT CERVICAL SPINE FINDINGS  Normal alignment is noted.  There is no evidence of acute fracture, subluxation or prevertebral soft tissue swelling.  Mild degenerative disc disease and spondylosis is C5-C6 and C6-7 noted.  No focal bony lesions are present.  The soft tissue structures are within normal limits.  IMPRESSION: No evidence of acute intracranial abnormality.  No static evidence of acute injury to the cervical spine.  Atrophy and chronic small-vessel white matter ischemic changes.  Mild degenerative disc disease and spondylosis from C5-C7.   Electronically Signed   By: Laveda Abbe M.D.   On: 07/07/2013 21:15   Ct Cervical Spine Wo Contrast  07/07/2013   CLINICAL DATA:  78 year old female with head and neck pain following fall.  EXAM: CT HEAD WITHOUT CONTRAST  CT CERVICAL SPINE WITHOUT CONTRAST  TECHNIQUE: Multidetector CT imaging of the head and cervical spine was performed following the standard protocol without intravenous contrast. Multiplanar CT image reconstructions of the cervical spine were also generated.  COMPARISON:  04/13/2013 CTs  FINDINGS: CT HEAD FINDINGS  Mild atrophy and moderate chronic small-vessel white matter ischemic changes are again noted.  No acute intracranial abnormalities are identified, including mass lesion or mass effect, hydrocephalus, extra-axial fluid collection, midline shift, hemorrhage, or acute infarction. The visualized bony calvarium is unremarkable.  CT CERVICAL SPINE FINDINGS  Normal alignment is noted.  There is no evidence of acute fracture, subluxation or prevertebral soft tissue swelling.  Mild degenerative disc disease and spondylosis is C5-C6  and C6-7 noted.  No focal bony lesions are present.  The soft tissue structures are within normal limits.  IMPRESSION: No evidence of acute intracranial abnormality.  No static evidence of acute injury to the cervical spine.  Atrophy and chronic small-vessel white matter ischemic changes.  Mild degenerative disc disease and spondylosis from C5-C7.   Electronically Signed   By: Laveda Abbe M.D.   On: 07/07/2013 21:15    Scheduled Meds: . aspirin  325 mg Oral Daily  . enoxaparin (LOVENOX) injection  40 mg Subcutaneous Q24H  . famotidine  40 mg Oral TID  . lisinopril  20 mg Oral Daily  . pantoprazole  40 mg Oral Daily   Continuous Infusions:     Time spent:  25 minutes  Kutter Schnepf  [facility] Pager  [phone]. If 7PM-7AM, please contact night-coverage at [email or web address], password [facility] 07/08/2013, 2:12 PM  LOS: 1 day

## 2013-07-08 NOTE — Progress Notes (Signed)
VASCULAR LAB PRELIMINARY  PRELIMINARY  PRELIMINARY  PRELIMINARY  Carotid Dopplers completed.    Preliminary report:  1-39% ICA stenosis.  Vertebral artery flow is antegrade.  Tortuous ICAs.  Mathew Storck, RVT 07/08/2013, 9:51 AM

## 2013-07-08 NOTE — Progress Notes (Signed)
  Echocardiogram 2D Echocardiogram has been performed.  Georgian Co 07/08/2013, 9:42 AM

## 2013-07-08 NOTE — Progress Notes (Signed)
Utilization Review completed.  

## 2013-07-09 DIAGNOSIS — I639 Cerebral infarction, unspecified: Secondary | ICD-10-CM | POA: Diagnosis present

## 2013-07-09 DIAGNOSIS — I6350 Cerebral infarction due to unspecified occlusion or stenosis of unspecified cerebral artery: Principal | ICD-10-CM

## 2013-07-09 MED ORDER — ATORVASTATIN CALCIUM 10 MG PO TABS: 10.0000 mg | ORAL_TABLET | Freq: Every day | ORAL | Status: DC

## 2013-07-09 MED ORDER — ZOLPIDEM TARTRATE 10 MG PO TABS: 5.0000 mg | ORAL_TABLET | Freq: Every evening | ORAL | Status: DC | PRN

## 2013-07-09 MED ORDER — CLOPIDOGREL BISULFATE 75 MG PO TABS
75.0000 mg | ORAL_TABLET | Freq: Every day | ORAL | Status: DC
  Administered 2013-07-10: 75 mg via ORAL
  Filled 2013-07-09 (×2): qty 1

## 2013-07-09 MED ORDER — CLOPIDOGREL BISULFATE 75 MG PO TABS: 75.0000 mg | ORAL_TABLET | Freq: Every day | ORAL | Status: DC

## 2013-07-09 NOTE — Progress Notes (Signed)
Physical Therapy Treatment Patient Details Name: Kim Blevins MRN: [id number] DOB: [birth year] Today's Date: 07/09/2013 Time: 1107-1130 PT Time Calculation (min): 23 min  PT Assessment / Plan / Recommendation  History of Present Illness Today around 10 AM the patient was in house and she had a fall. It was not witnessed by her husband heard a thud and when he came to the living room he found her on the ground close to her chair. He was able to stand her up and she was not confused but after lunch she wanted to sleep and was not waking up until 4pm, as her husband woke her up. At that time she was having slurred speech, she was confused and not oriented to time or place. She was able to identify her husband. And therefore he brought her to the hospital.   PT Comments   Pt making great progress with mobility and is close to/at baseline per spouse. Feel pt would be better served in outpatient for post acute rehab for strengthening and balance training/reeducation. Pt and spouse agreeable to this and he can provide the needed transportation to/from therapy.   Follow Up Recommendations  Supervision - Intermittent;Outpatient PT     Equipment Recommendations  None recommended by PT    Frequency Min 4X/week   Progress towards PT Goals Progress towards PT goals: Progressing toward goals  Plan Discharge plan needs to be updated    Precautions / Restrictions Precautions Precautions: Fall Restrictions Weight Bearing Restrictions: No       Mobility  Bed Mobility Supine to Sit: 6: Modified independent (Device/Increase time);HOB flat Sitting - Scoot to Edge of Bed: 6: Modified independent (Device/Increase time) Details for Bed Mobility Assistance: incr time needed. did not use rail, how ever does have one on bed at home. Bed elevated to home bed height as well. Transfers Sit to Stand: 5: Supervision;From bed Stand to Sit: 5: Supervision;To chair/3-in-1 Details for Transfer Assistance: no  physical assist needed. demo'd safe technique Ambulation/Gait Ambulation/Gait Assistance: 5: Supervision;4: Min guard Ambulation Distance (Feet): 500 Feet Assistive device: None Ambulation/Gait Assistance Details: min guard assist with dynamic gait activites only, otherwise was supervision with gait. Gait Pattern: Step-through pattern;Decreased stride length;Narrow base of support Gait velocity: decreased- except when performing dynamic gait and asked to "walk as fast as you can", pt was able to incr gait speed. Stairs Assistance: 4: Min guard Stair Management Technique: One rail Left;Alternating pattern;Forwards Number of Stairs: 12      PT Goals (current goals can now be found in the care plan section) Acute Rehab PT Goals Patient Stated Goal: to go home to husband PT Goal Formulation: With patient Time For Goal Achievement: 07/22/13 Potential to Achieve Goals: Fair  Visit Information  Last PT Received On: 07/09/13 Assistance Needed: +1 History of Present Illness: Today around 10 AM the patient was in house and she had a fall. It was not witnessed by her husband heard a thud and when he came to the living room he found her on the ground close to her chair. He was able to stand her up and she was not confused but after lunch she wanted to sleep and was not waking up until 4pm, as her husband woke her up. At that time she was having slurred speech, she was confused and not oriented to time or place. She was able to identify her husband. And therefore he brought her to the hospital.    Subjective Data  Patient Stated Goal: to go  home to husband   Cognition  Cognition Arousal/Alertness: Awake/alert Behavior During Therapy: WFL for tasks assessed/performed Overall Cognitive Status: Within Functional Limits for tasks assessed    Balance  Balance Balance Assessed: Yes Dynamic Gait Index Level Surface: Normal Change in Gait Speed: Normal Gait with Horizontal Head Turns: Mild  Impairment Gait with Vertical Head Turns: Mild Impairment Gait and Pivot Turn: Normal Step Over Obstacle: Mild Impairment Step Around Obstacles: Normal Steps: Normal Total Score: 21  End of Session PT - End of Session Equipment Utilized During Treatment: Gait belt Activity Tolerance: Patient tolerated treatment well Patient left: in chair;with call bell/phone within reach;with family/visitor present   GP     Sallyanne Kuster 07/09/2013, 11:35 AM  Sallyanne Kuster, PTA Office- [phone]

## 2013-07-09 NOTE — Progress Notes (Addendum)
[facility] PROGRESS NOTE  Kim Blevins ZOX:[id number] DOB: 1935/06/01 DOA: 07/07/2013 PCP: Toniann Fail, MD   Brief narrative 78 y.o. female with Past medical history of hypertension, GERD, chronic pain, recurrent fall. Patient had an episode of syncope and was also noticed to have a slurred speech. Syncope lasted for few seconds but she hit her forehead but denies any focal weakness, incontinence or seizure activity.  Head CT on admission was negative.  Assessment/Plan: CVA Syncope secondary to acute stroke as seen on MRI brain which showed Small linear nonhemorrhagic infarcts involving the medial superior right cerebellum and right temporoparietal white matter.also showed old lacunar infarcts. 2D echo and carotids unremarkable. Discussed with neurology. given involvement of both MCA and posterior circulation recommends TEE to r/o source of emboli.  Monitor on telemetry.  Since symptoms present while on full dose ASA i have switched her to plavix. Added lipitor. -Orthostasis negative. -Seen by PT eval and recommended home with out PT  Hypertension Continue lisinopril  Chronic back pain Continue oxycodone  Hypokalemia repleted    Diet: Cardiac  DVT prophylaxis: Subcutaneous heparin  Code Status: Full code Family Communication: husband  at bedside Disposition Plan: Home with home health once workup completed    Consultants:  neurology  Procedures:  TEE pending  Antibiotics:  None  HPI/Subjective: No overnight issues  Objective: Filed Vitals:   07/09/13 1400  BP: 142/72  Pulse: 72  Temp: 97.7 F (36.5 C)  Resp: 20    Intake/Output Summary (Last 24 hours) at 07/09/13 1516 Last data filed at 07/09/13 0905  Gross per 24 hour  Intake    520 ml  Output      0 ml  Net    520 ml   Filed Weights   07/07/13 1830 07/07/13 2338  Weight: 53.524 kg (118 lb) 52.7 kg (116 lb 2.9 oz)    Exam:   General:  Elderly thin built female in no acute  distress  HEENT: No pallor, moist oral mucosa  Chest: Clear to auscultation bilaterally, no added sounds  CVS: Normal S1-S2, no murmurs rub or gallop  Abdomen: Soft, nontender, bowel sounds present  Extremities: Warm, no edema  CNS: AAO x3, nonfocal, bruise over left forehead    Data Reviewed: Basic Metabolic Panel:  Recent Labs Lab 07/07/13 1906 07/08/13 0555  NA 136 138  K 3.6 3.3*  CL 103 104  CO2 24 22  GLUCOSE 93 88  BUN 14 11  CREATININE 0.80 0.79  CALCIUM 8.6 8.9   Liver Function Tests:  Recent Labs Lab 07/07/13 1906 07/08/13 0555  AST 15 14  ALT 12 13  ALKPHOS 48 51  BILITOT 0.3 0.5  PROT 6.0 6.3  ALBUMIN 3.3* 3.4*   No results found for this basename: LIPASE, AMYLASE,  in the last 168 hours No results found for this basename: AMMONIA,  in the last 168 hours CBC:  Recent Labs Lab 07/07/13 1906 07/08/13 0555  WBC 12.6* 9.7  NEUTROABS  --  5.3  HGB 11.1* 11.9*  HCT 33.8* 34.6*  MCV 93.6 91.1  PLT 204 227   Cardiac Enzymes:  Recent Labs Lab 07/07/13 1906 07/08/13 0555  TROPONINI <0.30 <0.30   BNP (last 3 results) No results found for this basename: PROBNP,  in the last 8760 hours CBG:  Recent Labs Lab 07/08/13 0643  GLUCAP 88    No results found for this or any previous visit (from the past 240 hour(s)).   Studies: Dg Chest 2 View  07/07/2013   CLINICAL DATA:  Dizziness.  History of fall.  EXAM: CHEST  2 VIEW  COMPARISON:  Chest x-ray 04/13/2013.  FINDINGS: There are some irregular opacity is in the region of the left lower lobe, which could represent a developing bronchopneumonia or sequela of mild aspiration. No pleural effusions. No evidence of pulmonary edema. No pneumothorax. Heart size is normal. Mediastinal contours are within normal limits. Atherosclerosis in the thoracic aorta.  IMPRESSION: 1. Irregular opacities in the left lower lobe may reflect developing bronchopneumonia or sequela of mild aspiration. 2. No definite  findings to suggest significant acute traumatic injury to the thorax on today's plain film examination.   Electronically Signed   By: Trudie Reed M.D.   On: 07/07/2013 20:29   Ct Head Wo Contrast  07/07/2013   CLINICAL DATA:  78 year old female with head and neck pain following fall.  EXAM: CT HEAD WITHOUT CONTRAST  CT CERVICAL SPINE WITHOUT CONTRAST  TECHNIQUE: Multidetector CT imaging of the head and cervical spine was performed following the standard protocol without intravenous contrast. Multiplanar CT image reconstructions of the cervical spine were also generated.  COMPARISON:  04/13/2013 CTs  FINDINGS: CT HEAD FINDINGS  Mild atrophy and moderate chronic small-vessel white matter ischemic changes are again noted.  No acute intracranial abnormalities are identified, including mass lesion or mass effect, hydrocephalus, extra-axial fluid collection, midline shift, hemorrhage, or acute infarction. The visualized bony calvarium is unremarkable.  CT CERVICAL SPINE FINDINGS  Normal alignment is noted.  There is no evidence of acute fracture, subluxation or prevertebral soft tissue swelling.  Mild degenerative disc disease and spondylosis is C5-C6 and C6-7 noted.  No focal bony lesions are present.  The soft tissue structures are within normal limits.  IMPRESSION: No evidence of acute intracranial abnormality.  No static evidence of acute injury to the cervical spine.  Atrophy and chronic small-vessel white matter ischemic changes.  Mild degenerative disc disease and spondylosis from C5-C7.   Electronically Signed   By: Laveda Abbe M.D.   On: 07/07/2013 21:15   Ct Cervical Spine Wo Contrast  07/07/2013   CLINICAL DATA:  78 year old female with head and neck pain following fall.  EXAM: CT HEAD WITHOUT CONTRAST  CT CERVICAL SPINE WITHOUT CONTRAST  TECHNIQUE: Multidetector CT imaging of the head and cervical spine was performed following the standard protocol without intravenous contrast. Multiplanar CT image  reconstructions of the cervical spine were also generated.  COMPARISON:  04/13/2013 CTs  FINDINGS: CT HEAD FINDINGS  Mild atrophy and moderate chronic small-vessel white matter ischemic changes are again noted.  No acute intracranial abnormalities are identified, including mass lesion or mass effect, hydrocephalus, extra-axial fluid collection, midline shift, hemorrhage, or acute infarction. The visualized bony calvarium is unremarkable.  CT CERVICAL SPINE FINDINGS  Normal alignment is noted.  There is no evidence of acute fracture, subluxation or prevertebral soft tissue swelling.  Mild degenerative disc disease and spondylosis is C5-C6 and C6-7 noted.  No focal bony lesions are present.  The soft tissue structures are within normal limits.  IMPRESSION: No evidence of acute intracranial abnormality.  No static evidence of acute injury to the cervical spine.  Atrophy and chronic small-vessel white matter ischemic changes.  Mild degenerative disc disease and spondylosis from C5-C7.   Electronically Signed   By: Laveda Abbe M.D.   On: 07/07/2013 21:15   Mri Brain Without Contrast  07/08/2013   CLINICAL DATA:  TIA.  Recurrent falls.  EXAM: MRI HEAD WITHOUT CONTRAST  MRA HEAD WITHOUT CONTRAST  TECHNIQUE: Multiplanar, multiecho pulse sequences of the brain and surrounding structures were obtained without intravenous contrast. Angiographic images of the head were obtained using MRA technique without contrast.  COMPARISON:  CT head without contrast 07/07/2013. MRI brain 04/13/2013.  FINDINGS: MRI HEAD FINDINGS  A punctate nonhemorrhagic infarct is present within the medial superior right cerebellum. There is also a 9 mm nonhemorrhagic infarct within the white matter of the rib right parietal and temporal lobe. Atrophy and diffuse white matter disease is otherwise stable. There are remote lacunar infarcts involving the basal ganglia bilaterally. These are stable. White matter changes extend into the brainstem.  Flow is  present in the major intracranial arteries. The patient is status post left lens extraction. The ventricles are proportionate to the degree of atrophy. No significant extra-axial fluid collection is present. Midline structures are within normal limits.  The paranasal sinuses and mastoid air cells are clear.  MRA HEAD FINDINGS  The internal carotid arteries are within normal limits from the high cervical segments through the ICA termini bilaterally. A 2 mm aneurysm is evident at the level of the left ophthalmic artery. There is asymmetric attenuation of the left A1 segment suggesting proximal stenosis. The right A1 segment and bilateral M1 segments are patent. The anterior communicating artery is patent. Bilateral distal small vessel disease is evident.  The right vertebral artery is dominant. The right PICA origin is visualized and normal. The left AICA is dominant. The basilar artery is within normal limits. Both posterior cerebral arteries originate from the basilar tip. There is some segmental attenuation in the posterior cerebral arteries bilaterally.  IMPRESSION: 1. Small linear nonhemorrhagic infarcts involving the medial superior right cerebellum and right temporoparietal white matter. 2. Otherwise stable atrophy and diffuse white matter disease. This likely reflects the sequelae of chronic microvascular ischemia. 3. Stable remote lacunar infarcts of the basal ganglia. 4. 8 mm periophthalmic artery aneurysm. 5. Mild distal small vessel disease.   Electronically Signed   By: Gennette Pac M.D.   On: 07/08/2013 20:48   Mr Maxine Glenn Head/brain Wo Cm  07/08/2013   CLINICAL DATA:  TIA.  Recurrent falls.  EXAM: MRI HEAD WITHOUT CONTRAST  MRA HEAD WITHOUT CONTRAST  TECHNIQUE: Multiplanar, multiecho pulse sequences of the brain and surrounding structures were obtained without intravenous contrast. Angiographic images of the head were obtained using MRA technique without contrast.  COMPARISON:  CT head without contrast  07/07/2013. MRI brain 04/13/2013.  FINDINGS: MRI HEAD FINDINGS  A punctate nonhemorrhagic infarct is present within the medial superior right cerebellum. There is also a 9 mm nonhemorrhagic infarct within the white matter of the rib right parietal and temporal lobe. Atrophy and diffuse white matter disease is otherwise stable. There are remote lacunar infarcts involving the basal ganglia bilaterally. These are stable. White matter changes extend into the brainstem.  Flow is present in the major intracranial arteries. The patient is status post left lens extraction. The ventricles are proportionate to the degree of atrophy. No significant extra-axial fluid collection is present. Midline structures are within normal limits.  The paranasal sinuses and mastoid air cells are clear.  MRA HEAD FINDINGS  The internal carotid arteries are within normal limits from the high cervical segments through the ICA termini bilaterally. A 2 mm aneurysm is evident at the level of the left ophthalmic artery. There is asymmetric attenuation of the left A1 segment suggesting proximal stenosis. The right A1 segment and bilateral M1 segments are patent. The anterior  communicating artery is patent. Bilateral distal small vessel disease is evident.  The right vertebral artery is dominant. The right PICA origin is visualized and normal. The left AICA is dominant. The basilar artery is within normal limits. Both posterior cerebral arteries originate from the basilar tip. There is some segmental attenuation in the posterior cerebral arteries bilaterally.  IMPRESSION: 1. Small linear nonhemorrhagic infarcts involving the medial superior right cerebellum and right temporoparietal white matter. 2. Otherwise stable atrophy and diffuse white matter disease. This likely reflects the sequelae of chronic microvascular ischemia. 3. Stable remote lacunar infarcts of the basal ganglia. 4. 8 mm periophthalmic artery aneurysm. 5. Mild distal small vessel  disease.   Electronically Signed   By: Gennette Pac M.D.   On: 07/08/2013 20:48    Scheduled Meds: . [START ON 07/10/2013] clopidogrel  75 mg Oral Q breakfast  . enoxaparin (LOVENOX) injection  40 mg Subcutaneous Q24H  . famotidine  40 mg Oral TID  . lisinopril  20 mg Oral Daily  . pantoprazole  40 mg Oral Daily   Continuous Infusions:     Time spent:  25 minutes  Eddie Koc  [facility] Pager  [phone]. If 7PM-7AM, please contact night-coverage at [email or web address], password [facility] 07/09/2013, 3:16 PM  LOS: 2 days

## 2013-07-09 NOTE — Consult Note (Signed)
Referring Physician: Dhungel    Chief Complaint: Slurred speech after fall  HPI: Kim Blevins is an 78 y.o. female with a history of recurrent falls.  ON the day of admission around 10 AM the patient was in house and she had a fall. It was not witnessed by her husband who heard a thud and when he came to the living room he found her on the ground close to her chair. He was able to stand her up and she was not confused but after lunch she wanted to sleep.  When she awakened about 4pm, she was having slurred speech, was confused and not oriented to time or place. She was able to identify her husband.  At that time she was brought to the ED for evaluation.  Although the patient has had falls in the past her confusion and slurred speech were not typical for her.  It is unclear how the patient fell.  Patient is amnestic of the event.  Date last known well: Date: 07/07/2013 Time last known well: Time: 10:00 tPA Given: No: Outside time window  Past Medical History  Diagnosis Date  . Hypertension   . GERD (gastroesophageal reflux disease)     Past Surgical History  Procedure Laterality Date  . Appendectomy      Family history: No family history of stroke  Social History:  reports that she has never smoked. She does not have any smokeless tobacco history on file. She reports that she does not drink alcohol. Her drug history is not on file.  Allergies:  Allergies  Allergen Reactions  . Plaquenil [Hydroxychloroquine Sulfate] Rash    Medications:  I have reviewed the patient's current medications. Prior to Admission:  Prescriptions prior to admission  Medication Sig Dispense Refill  . aspirin 325 MG tablet Take 325 mg by mouth daily.      . butalbital-acetaminophen-caffeine (FIORICET, ESGIC) 50-325-40 MG per tablet Take 2 tablets by mouth every 6 (six) hours as needed for headache. For headache      . famotidine (PEPCID) 40 MG tablet Take 40 mg by mouth 3 (three) times daily.      Marland Kitchen  lisinopril (PRINIVIL,ZESTRIL) 20 MG tablet Take 20 mg by mouth daily.      Marland Kitchen omeprazole (PRILOSEC) 20 MG capsule Take 20 mg by mouth daily.      Marland Kitchen oxyCODONE-acetaminophen (PERCOCET) 7.5-325 MG per tablet Take 1 tablet by mouth every 6 (six) hours as needed for pain. For pain      . [DISCONTINUED] zolpidem (AMBIEN) 10 MG tablet Take 10 mg by mouth at bedtime as needed for sleep.       Scheduled: . [START ON 07/10/2013] clopidogrel  75 mg Oral Q breakfast  . enoxaparin (LOVENOX) injection  40 mg Subcutaneous Q24H  . famotidine  40 mg Oral TID  . lisinopril  20 mg Oral Daily  . pantoprazole  40 mg Oral Daily    ROS: History obtained from the patient  General ROS: negative for - chills, fatigue, fever, night sweats, weight gain or weight loss Psychological ROS: negative for - behavioral disorder, hallucinations, memory difficulties, mood swings or suicidal ideation Ophthalmic ROS: negative for - blurry vision, double vision, eye pain or loss of vision ENT ROS: negative for - epistaxis, nasal discharge, oral lesions, sore throat, tinnitus or vertigo Allergy and Immunology ROS: negative for - hives or itchy/watery eyes Hematological and Lymphatic ROS: bruise on forehead Endocrine ROS: negative for - galactorrhea, hair pattern changes, polydipsia/polyuria or  temperature intolerance Respiratory ROS: negative for - cough, hemoptysis, shortness of breath or wheezing Cardiovascular ROS: negative for - chest pain, dyspnea on exertion, edema or irregular heartbeat Gastrointestinal ROS: negative for - abdominal pain, diarrhea, hematemesis, nausea/vomiting or stool incontinence Genito-Urinary ROS: negative for - dysuria, hematuria, incontinence or urinary frequency/urgency Musculoskeletal ROS: back pain Neurological ROS: as noted in HPI Dermatological ROS: negative for rash and skin lesion changes  Physical Examination: Blood pressure 142/72, pulse 72, temperature 97.7 F (36.5 C), temperature  source Oral, resp. rate 20, height 5\' 3"  (1.6 m), weight 52.7 kg (116 lb 2.9 oz), SpO2 96.00%.  Neurologic Examination: Mental Status: Alert, oriented, thought content appropriate.  Speech fluent without evidence of aphasia.  Able to follow 3 step commands but requires some reinforcement. Cranial Nerves: II: Discs flat bilaterally; Visual fields grossly normal, pupils equal, round, reactive to light and accommodation III,IV, VI: ptosis not present, extra-ocular motions intact bilaterally V,VII: smile symmetric, facial light touch sensation normal bilaterally VIII: hearing normal bilaterally IX,X: gag reflex present XI: bilateral shoulder shrug XII: midline tongue extension Motor: Right : Upper extremity   5/5 with pronator drift   Left:     Upper extremity   5/5  Lower extremity   5/5       Lower extremity   5/5 Tone and bulk:normal tone throughout; no atrophy noted Sensory: Pinprick and light touch intact throughout, bilaterally Deep Tendon Reflexes: 2+ and symmetric throughout with 1+ AJ's bilaterally Plantars: Right: downgoing   Left: downgoing Cerebellar: normal finger-to-nose and normal heel-to-shin test Gait: narrow based CV: pulses palpable throughout    Laboratory Studies:  Basic Metabolic Panel:  Recent Labs Lab 07/07/13 1906 07/08/13 0555  NA 136 138  K 3.6 3.3*  CL 103 104  CO2 24 22  GLUCOSE 93 88  BUN 14 11  CREATININE 0.80 0.79  CALCIUM 8.6 8.9    Liver Function Tests:  Recent Labs Lab 07/07/13 1906 07/08/13 0555  AST 15 14  ALT 12 13  ALKPHOS 48 51  BILITOT 0.3 0.5  PROT 6.0 6.3  ALBUMIN 3.3* 3.4*   No results found for this basename: LIPASE, AMYLASE,  in the last 168 hours No results found for this basename: AMMONIA,  in the last 168 hours  CBC:  Recent Labs Lab 07/07/13 1906 07/08/13 0555  WBC 12.6* 9.7  NEUTROABS  --  5.3  HGB 11.1* 11.9*  HCT 33.8* 34.6*  MCV 93.6 91.1  PLT 204 227    Cardiac Enzymes:  Recent Labs Lab  07/07/13 1906 07/08/13 0555  TROPONINI <0.30 <0.30    BNP: No components found with this basename: POCBNP,   CBG:  Recent Labs Lab 07/08/13 0643  GLUCAP 88    Microbiology: No results found for this or any previous visit.  Coagulation Studies:  Recent Labs  07/07/13 1910  LABPROT 13.1  INR 1.01    Urinalysis:  Recent Labs Lab 07/07/13 1914 07/08/13 1730  COLORURINE YELLOW AMBER*  LABSPEC 1.015 1.040*  PHURINE 7.0 5.5  GLUCOSEU NEGATIVE NEGATIVE  HGBUR TRACE* SMALL*  BILIRUBINUR NEGATIVE SMALL*  KETONESUR NEGATIVE 15*  PROTEINUR NEGATIVE NEGATIVE  UROBILINOGEN 0.2 0.2  NITRITE NEGATIVE NEGATIVE  LEUKOCYTESUR NEGATIVE TRACE*    Lipid Panel:    Component Value Date/Time   CHOL 174 07/08/2013 0555   TRIG 62 07/08/2013 0555   HDL 68 07/08/2013 0555   CHOLHDL 2.6 07/08/2013 0555   VLDL 12 07/08/2013 0555   LDLCALC 94 07/08/2013 0555  HgbA1C:  Lab Results  Component Value Date   HGBA1C 5.5 07/08/2013    Urine Drug Screen:   No results found for this basename: labopia, cocainscrnur, labbenz, amphetmu, thcu, labbarb    Alcohol Level: No results found for this basename: ETH,  in the last 168 hours  Other results: EKG: sinus rhythm at 61 bpm.  Imaging: Dg Chest 2 View  07/07/2013   CLINICAL DATA:  Dizziness.  History of fall.  EXAM: CHEST  2 VIEW  COMPARISON:  Chest x-ray 04/13/2013.  FINDINGS: There are some irregular opacity is in the region of the left lower lobe, which could represent a developing bronchopneumonia or sequela of mild aspiration. No pleural effusions. No evidence of pulmonary edema. No pneumothorax. Heart size is normal. Mediastinal contours are within normal limits. Atherosclerosis in the thoracic aorta.  IMPRESSION: 1. Irregular opacities in the left lower lobe may reflect developing bronchopneumonia or sequela of mild aspiration. 2. No definite findings to suggest significant acute traumatic injury to the thorax on today's plain  film examination.   Electronically Signed   By: Trudie Reed M.D.   On: 07/07/2013 20:29   Ct Head Wo Contrast  07/07/2013   CLINICAL DATA:  78 year old female with head and neck pain following fall.  EXAM: CT HEAD WITHOUT CONTRAST  CT CERVICAL SPINE WITHOUT CONTRAST  TECHNIQUE: Multidetector CT imaging of the head and cervical spine was performed following the standard protocol without intravenous contrast. Multiplanar CT image reconstructions of the cervical spine were also generated.  COMPARISON:  04/13/2013 CTs  FINDINGS: CT HEAD FINDINGS  Mild atrophy and moderate chronic small-vessel white matter ischemic changes are again noted.  No acute intracranial abnormalities are identified, including mass lesion or mass effect, hydrocephalus, extra-axial fluid collection, midline shift, hemorrhage, or acute infarction. The visualized bony calvarium is unremarkable.  CT CERVICAL SPINE FINDINGS  Normal alignment is noted.  There is no evidence of acute fracture, subluxation or prevertebral soft tissue swelling.  Mild degenerative disc disease and spondylosis is C5-C6 and C6-7 noted.  No focal bony lesions are present.  The soft tissue structures are within normal limits.  IMPRESSION: No evidence of acute intracranial abnormality.  No static evidence of acute injury to the cervical spine.  Atrophy and chronic small-vessel white matter ischemic changes.  Mild degenerative disc disease and spondylosis from C5-C7.   Electronically Signed   By: Laveda Abbe M.D.   On: 07/07/2013 21:15   Ct Cervical Spine Wo Contrast  07/07/2013   CLINICAL DATA:  78 year old female with head and neck pain following fall.  EXAM: CT HEAD WITHOUT CONTRAST  CT CERVICAL SPINE WITHOUT CONTRAST  TECHNIQUE: Multidetector CT imaging of the head and cervical spine was performed following the standard protocol without intravenous contrast. Multiplanar CT image reconstructions of the cervical spine were also generated.  COMPARISON:  04/13/2013 CTs   FINDINGS: CT HEAD FINDINGS  Mild atrophy and moderate chronic small-vessel white matter ischemic changes are again noted.  No acute intracranial abnormalities are identified, including mass lesion or mass effect, hydrocephalus, extra-axial fluid collection, midline shift, hemorrhage, or acute infarction. The visualized bony calvarium is unremarkable.  CT CERVICAL SPINE FINDINGS  Normal alignment is noted.  There is no evidence of acute fracture, subluxation or prevertebral soft tissue swelling.  Mild degenerative disc disease and spondylosis is C5-C6 and C6-7 noted.  No focal bony lesions are present.  The soft tissue structures are within normal limits.  IMPRESSION: No evidence of acute intracranial abnormality.  No static evidence of acute injury to the cervical spine.  Atrophy and chronic small-vessel white matter ischemic changes.  Mild degenerative disc disease and spondylosis from C5-C7.   Electronically Signed   By: Laveda Abbe M.D.   On: 07/07/2013 21:15   Mri Brain Without Contrast  07/08/2013   CLINICAL DATA:  TIA.  Recurrent falls.  EXAM: MRI HEAD WITHOUT CONTRAST  MRA HEAD WITHOUT CONTRAST  TECHNIQUE: Multiplanar, multiecho pulse sequences of the brain and surrounding structures were obtained without intravenous contrast. Angiographic images of the head were obtained using MRA technique without contrast.  COMPARISON:  CT head without contrast 07/07/2013. MRI brain 04/13/2013.  FINDINGS: MRI HEAD FINDINGS  A punctate nonhemorrhagic infarct is present within the medial superior right cerebellum. There is also a 9 mm nonhemorrhagic infarct within the white matter of the rib right parietal and temporal lobe. Atrophy and diffuse white matter disease is otherwise stable. There are remote lacunar infarcts involving the basal ganglia bilaterally. These are stable. White matter changes extend into the brainstem.  Flow is present in the major intracranial arteries. The patient is status post left lens  extraction. The ventricles are proportionate to the degree of atrophy. No significant extra-axial fluid collection is present. Midline structures are within normal limits.  The paranasal sinuses and mastoid air cells are clear.  MRA HEAD FINDINGS  The internal carotid arteries are within normal limits from the high cervical segments through the ICA termini bilaterally. A 2 mm aneurysm is evident at the level of the left ophthalmic artery. There is asymmetric attenuation of the left A1 segment suggesting proximal stenosis. The right A1 segment and bilateral M1 segments are patent. The anterior communicating artery is patent. Bilateral distal small vessel disease is evident.  The right vertebral artery is dominant. The right PICA origin is visualized and normal. The left AICA is dominant. The basilar artery is within normal limits. Both posterior cerebral arteries originate from the basilar tip. There is some segmental attenuation in the posterior cerebral arteries bilaterally.  IMPRESSION: 1. Small linear nonhemorrhagic infarcts involving the medial superior right cerebellum and right temporoparietal white matter. 2. Otherwise stable atrophy and diffuse white matter disease. This likely reflects the sequelae of chronic microvascular ischemia. 3. Stable remote lacunar infarcts of the basal ganglia. 4. 8 mm periophthalmic artery aneurysm. 5. Mild distal small vessel disease.   Electronically Signed   By: Gennette Pac M.D.   On: 07/08/2013 20:48   Mr Maxine Glenn Head/brain Wo Cm  07/08/2013   CLINICAL DATA:  TIA.  Recurrent falls.  EXAM: MRI HEAD WITHOUT CONTRAST  MRA HEAD WITHOUT CONTRAST  TECHNIQUE: Multiplanar, multiecho pulse sequences of the brain and surrounding structures were obtained without intravenous contrast. Angiographic images of the head were obtained using MRA technique without contrast.  COMPARISON:  CT head without contrast 07/07/2013. MRI brain 04/13/2013.  FINDINGS: MRI HEAD FINDINGS  A punctate  nonhemorrhagic infarct is present within the medial superior right cerebellum. There is also a 9 mm nonhemorrhagic infarct within the white matter of the rib right parietal and temporal lobe. Atrophy and diffuse white matter disease is otherwise stable. There are remote lacunar infarcts involving the basal ganglia bilaterally. These are stable. White matter changes extend into the brainstem.  Flow is present in the major intracranial arteries. The patient is status post left lens extraction. The ventricles are proportionate to the degree of atrophy. No significant extra-axial fluid collection is present. Midline structures are within normal limits.  The paranasal  sinuses and mastoid air cells are clear.  MRA HEAD FINDINGS  The internal carotid arteries are within normal limits from the high cervical segments through the ICA termini bilaterally. A 2 mm aneurysm is evident at the level of the left ophthalmic artery. There is asymmetric attenuation of the left A1 segment suggesting proximal stenosis. The right A1 segment and bilateral M1 segments are patent. The anterior communicating artery is patent. Bilateral distal small vessel disease is evident.  The right vertebral artery is dominant. The right PICA origin is visualized and normal. The left AICA is dominant. The basilar artery is within normal limits. Both posterior cerebral arteries originate from the basilar tip. There is some segmental attenuation in the posterior cerebral arteries bilaterally.  IMPRESSION: 1. Small linear nonhemorrhagic infarcts involving the medial superior right cerebellum and right temporoparietal white matter. 2. Otherwise stable atrophy and diffuse white matter disease. This likely reflects the sequelae of chronic microvascular ischemia. 3. Stable remote lacunar infarcts of the basal ganglia. 4. 8 mm periophthalmic artery aneurysm. 5. Mild distal small vessel disease.   Electronically Signed   By: Gennette Pac M.D.   On: 07/08/2013  20:48    Assessment: 78 y.o. female presenting after a fall with slurred speech and confusion that has now resolved. Marland Kitchen MRI reviewed and shows a small acute in the right cerebellum and right temporoparietal white matter.  Patient on ASA at home.  Has been changed to Plavix.  Echocardiogram and carotid dopplers are unremarkable.  A1c 5.5 and LDL 94.   With more than one vascular distribution affected concerned about cardiac etiology.  Further work up recommended.  Stroke Risk Factors - hypertension  Plan: 1. TEE 2. Prophylactic therapy-Continue Plavix 3. Telemetry monitoring 4. Frequent neuro checks  Case discussed with Dr. Edwena Blow, MD [facility] [phone] 07/09/2013, 3:56 PM

## 2013-07-09 NOTE — Progress Notes (Signed)
Occupational Therapy Treatment Patient Details Name: Kim Blevins MRN: [id number] DOB: 1935-01-26 Today's Date: 07/09/2013 Time: 4098-1191 OT Time Calculation (min): 19 min  OT Assessment / Plan / Recommendation  History of present illness Today around 10 AM the patient was in house and she had a fall. It was not witnessed by her husband heard a thud and when he came to the living room he found her on the ground close to her chair. He was able to stand her up and she was not confused but after lunch she wanted to sleep and was not waking up until 4pm, as her husband woke her up. At that time she was having slurred speech, she was confused and not oriented to time or place. She was able to identify her husband. And therefore he brought her to the hospital.   OT comments  Pt moving well during session. Education provided to both pt and spouse.   Follow Up Recommendations  Supervision - Intermittent;Outpatient OT    Barriers to Discharge       Equipment Recommendations  None recommended by OT    Recommendations for Other Services    Frequency Min 2X/week   Progress towards OT Goals Progress towards OT goals: Progressing toward goals  Plan Discharge plan needs to be updated    Precautions / Restrictions Precautions Precautions: Fall Restrictions Weight Bearing Restrictions: No   Pertinent Vitals/Pain No pain reported.     ADL  Grooming: Wash/dry face;Teeth care;Supervision/safety Where Assessed - Grooming: Supported standing;Unsupported standing Toilet Transfer: Radiographer, therapeutic Method: Sit to Barista: Regular height toilet;Comfort height toilet Toileting - Clothing Manipulation and Hygiene: Supervision/safety Where Assessed - Engineer, mining and Hygiene: Standing Tub/Shower Transfer: Therapist, sports Method: Science writer: Walk in shower Transfers/Ambulation Related  to ADLs: Supervision  ADL Comments: Educated to sit to bathe and also for LB dressing. Educated to stand in front of chair/bed when pulling up LB clothing. Educated on signs/symptoms of stroke and importance of getting help right away. Checked vision briefly and tracking was difficult and she was inconsistent with right superior quadrant in field testing. Educated to have MD check vision and discuss driving prior to d/c (pt states she rarely drives, if ever).     OT Diagnosis:    OT Problem List:   OT Treatment Interventions:     OT Goals(current goals can now be found in the care plan section) Acute Rehab OT Goals Patient Stated Goal: go home OT Goal Formulation: With patient Time For Goal Achievement: 07/22/13 Potential to Achieve Goals: Good ADL Goals Pt Will Perform Grooming: with modified independence;standing Pt Will Perform Tub/Shower Transfer: with supervision;ambulating Additional ADL Goal #1: Pt will complete DGI assessment with score higer than 19 to demonstrate decr fall risk with ADLS  Visit Information  Last OT Received On: 07/09/13 Assistance Needed: +1 History of Present Illness: Today around 10 AM the patient was in house and she had a fall. It was not witnessed by her husband heard a thud and when he came to the living room he found her on the ground close to her chair. He was able to stand her up and she was not confused but after lunch she wanted to sleep and was not waking up until 4pm, as her husband woke her up. At that time she was having slurred speech, she was confused and not oriented to time or place. She was able to identify her husband. And therefore he brought  her to the hospital.    Subjective Data      Prior Functioning       Cognition  Cognition Arousal/Alertness: Awake/alert Behavior During Therapy: WFL for tasks assessed/performed Overall Cognitive Status: Within Functional Limits for tasks assessed    Mobility  Bed Mobility Bed Mobility:  Supine to Sit Supine to Sit: 6: Modified independent (Device/Increase time) Transfers Transfers: Sit to Stand;Stand to Sit Sit to Stand: 5: Supervision;From bed;From toilet Stand to Sit: 5: Supervision;To chair/3-in-1;To toilet Details for Transfer Assistance: Supervision for safety. Cue for technique for toilet transfer.    Exercises      Balance     End of Session OT - End of Session Activity Tolerance: Patient tolerated treatment well Patient left: in chair;with family/visitor present;with call bell/phone within reach  GO     Earlie Raveling OTR/L [phone] 07/09/2013, 4:02 PM

## 2013-07-09 NOTE — Progress Notes (Signed)
Noted pt's improvement and ability to now tolerate OPPT on discharge. Agree with update in discharge plan.  07/09/2013 Veda Canning, PT Pager: [phone]

## 2013-07-10 ENCOUNTER — Encounter (HOSPITAL_COMMUNITY): Payer: Self-pay | Admitting: *Deleted

## 2013-07-10 ENCOUNTER — Encounter (HOSPITAL_COMMUNITY)
Admission: EM | Disposition: A | Discharge status: Home / Self Care | Payer: Self-pay | Source: Home / Self Care | Attending: Internal Medicine

## 2013-07-10 DIAGNOSIS — I6789 Other cerebrovascular disease: Secondary | ICD-10-CM

## 2013-07-10 DIAGNOSIS — I10 Essential (primary) hypertension: Secondary | ICD-10-CM

## 2013-07-10 HISTORY — PX: TEE WITHOUT CARDIOVERSION: SHX5443

## 2013-07-10 LAB — GLUCOSE, CAPILLARY

## 2013-07-10 SURGERY — ECHOCARDIOGRAM, TRANSESOPHAGEAL
Anesthesia: Moderate Sedation

## 2013-07-10 MED ORDER — FENTANYL CITRATE 0.05 MG/ML IJ SOLN
INTRAMUSCULAR | Status: AC
  Filled 2013-07-10: qty 2

## 2013-07-10 MED ORDER — MIDAZOLAM HCL 10 MG/2ML IJ SOLN
INTRAMUSCULAR | Status: DC | PRN
  Administered 2013-07-10 (×2): 2 mg via INTRAVENOUS

## 2013-07-10 MED ORDER — FENTANYL CITRATE 0.05 MG/ML IJ SOLN
INTRAMUSCULAR | Status: DC | PRN
  Administered 2013-07-10 (×2): 25 ug via INTRAVENOUS

## 2013-07-10 MED ORDER — MIDAZOLAM HCL 5 MG/ML IJ SOLN
INTRAMUSCULAR | Status: AC
  Filled 2013-07-10: qty 2

## 2013-07-10 MED ORDER — BUTAMBEN-TETRACAINE-BENZOCAINE 2-2-14 % EX AERO
INHALATION_SPRAY | CUTANEOUS | Status: DC | PRN
  Administered 2013-07-10: 2 via TOPICAL

## 2013-07-10 NOTE — Interval H&P Note (Signed)
History and Physical Interval Note:  07/10/2013 3:13 PM  Kim Blevins  has presented today for surgery, with the diagnosis of stroke  The various methods of treatment have been discussed with the patient and family. After consideration of risks, benefits and other options for treatment, the patient has consented to  Procedure(s): TRANSESOPHAGEAL ECHOCARDIOGRAM (TEE) (N/A) as a surgical intervention .  The patient's history has been reviewed, patient examined, no change in status, stable for surgery.  I have reviewed the patient's chart and labs.  Questions were answered to the patient's satisfaction.     Elyn Aquas.

## 2013-07-10 NOTE — Progress Notes (Signed)
Patient is discharged with Out patient Therapy but case manager not available at the moment. Talked to her  about the recommendation and she made mention of working on it tomorrow.

## 2013-07-10 NOTE — Discharge Summary (Signed)
Physician Discharge Summary  JEREMY MCLAMB WUJ:[id number] DOB: 1934/12/14 DOA: 07/07/2013  PCP: Toniann Fail, MD  Admit date: 07/07/2013 Discharge date: 07/10/2013  Time spent: 40 minutes  Recommendations for Outpatient Follow-up:  1. Home with outpt PT 2. Follow up with Dr Pearlean Brownie in 2 months  Discharge Diagnoses:  Principal Problem:   CVA (cerebral infarction)  Active Problems:   Fall   Hypertension   Chronic pain syndrome   Hypokalemia   Discharge Condition: Fair  Diet recommendation: cardiac  Filed Weights   07/07/13 1830 07/07/13 2338  Weight: 53.524 kg (118 lb) 52.7 kg (116 lb 2.9 oz)    History of present illness:  Please refer to admission H&P for details, but in brief, 78 y.o. female with Past medical history of hypertension, GERD, chronic pain, recurrent fall. Patient had an episode of syncope and was also noticed to have a slurred speech. Syncope lasted for few seconds but she hit her forehead but denies any focal weakness, incontinence or seizure activity.  Head CT on admission was negative.   Hospital Course:  CVA  Syncope secondary to acute stroke as seen on MRI brain which showed Small linear nonhemorrhagic infarcts involving the medial superior right cerebellum and right temporoparietal white matter.also showed old lacunar infarcts. 2D echo and carotids unremarkable. Discussed with neurology. given involvement of both MCA and posterior circulation recommends TEE to r/o source of emboli.  -TEE done today no source of emboli. study was negative for ASD or PFO. As per cardiology There was a late right to left shunting during bubble study suggest either fenestrated atrial septum or pulmonary AVM . An aneurismal atrial septum does not need any further follow up.   Since symptoms present while on full dose ASA i have switched her to plavix. Added lipitor.  -Orthostasis negative.  -Seen by PT eval and recommended home with out pt PT   Hypertension  Continue  lisinopril   Chronic back pain  Continue oxycodone   Hypokalemia  repleted   Diet: Cardiac   Code Status: Full code  Family Communication: husband at bedside  Disposition Plan: Home with outpt PT  Consultants:  Neurology Cardiology for TEE  Procedures:  TEE    Antibiotics:  None   Discharge Exam: Filed Vitals:   07/10/13 1555  BP: 177/76  Pulse:   Temp:   Resp: 20    General: Elderly thin built female in no acute distress  HEENT: No pallor, moist oral mucosa  Chest: Clear to auscultation bilaterally, no added sounds  CVS: Normal S1-S2, no murmurs rub or gallop  Abdomen: Soft, nontender, bowel sounds present  Extremities: Warm, no edema  CNS: AAO x3, nonfocal, bruise over left forehead   Discharge Instructions     Medication List    STOP taking these medications       aspirin 325 MG tablet      TAKE these medications       atorvastatin 10 MG tablet  Commonly known as:  LIPITOR  Take 1 tablet (10 mg total) by mouth daily.     butalbital-acetaminophen-caffeine 50-325-40 MG per tablet  Commonly known as:  FIORICET, ESGIC  Take 2 tablets by mouth every 6 (six) hours as needed for headache. For headache     clopidogrel 75 MG tablet  Commonly known as:  PLAVIX  Take 1 tablet (75 mg total) by mouth daily with breakfast.     famotidine 40 MG tablet  Commonly known as:  PEPCID  Take 40 mg  by mouth 3 (three) times daily.     lisinopril 20 MG tablet  Commonly known as:  PRINIVIL,ZESTRIL  Take 20 mg by mouth daily.     omeprazole 20 MG capsule  Commonly known as:  PRILOSEC  Take 20 mg by mouth daily.     oxyCODONE-acetaminophen 7.5-325 MG per tablet  Commonly known as:  PERCOCET  Take 1 tablet by mouth every 6 (six) hours as needed for pain. For pain     zolpidem 10 MG tablet  Commonly known as:  AMBIEN  Take 0.5 tablets (5 mg total) by mouth at bedtime as needed for sleep.       Allergies  Allergen Reactions  . Plaquenil  [Hydroxychloroquine Sulfate] Rash       Follow-up Information   Follow up with HAWKS,AL N, MD. Schedule an appointment as soon as possible for a visit in 1 week.   Specialty:  Family Medicine   Contact information:   [address] [place] [address] [phone]       Follow up with Gates Rigg, MD In 2 months.   Specialties:  Neurology, Radiology   Contact information:   [address] [place] [address] [phone]       Follow up with Toniann Fail, MD In 1 week.   Specialty:  Family Medicine   Contact information:   [address] [place] [address] [phone]        The results of significant diagnostics from this hospitalization (including imaging, microbiology, ancillary and laboratory) are listed below for reference.    Significant Diagnostic Studies: Dg Chest 2 View  07/07/2013   CLINICAL DATA:  Dizziness.  History of fall.  EXAM: CHEST  2 VIEW  COMPARISON:  Chest x-ray 04/13/2013.  FINDINGS: There are some irregular opacity is in the region of the left lower lobe, which could represent a developing bronchopneumonia or sequela of mild aspiration. No pleural effusions. No evidence of pulmonary edema. No pneumothorax. Heart size is normal. Mediastinal contours are within normal limits. Atherosclerosis in the thoracic aorta.  IMPRESSION: 1. Irregular opacities in the left lower lobe may reflect developing bronchopneumonia or sequela of mild aspiration. 2. No definite findings to suggest significant acute traumatic injury to the thorax on today's plain film examination.   Electronically Signed   By: Trudie Reed M.D.   On: 07/07/2013 20:29   Ct Head Wo Contrast  07/07/2013   CLINICAL DATA:  78 year old female with head and neck pain following fall.  EXAM: CT HEAD WITHOUT CONTRAST  CT CERVICAL SPINE WITHOUT CONTRAST  TECHNIQUE: Multidetector CT imaging of the head and cervical spine was performed following the  standard protocol without intravenous contrast. Multiplanar CT image reconstructions of the cervical spine were also generated.  COMPARISON:  04/13/2013 CTs  FINDINGS: CT HEAD FINDINGS  Mild atrophy and moderate chronic small-vessel white matter ischemic changes are again noted.  No acute intracranial abnormalities are identified, including mass lesion or mass effect, hydrocephalus, extra-axial fluid collection, midline shift, hemorrhage, or acute infarction. The visualized bony calvarium is unremarkable.  CT CERVICAL SPINE FINDINGS  Normal alignment is noted.  There is no evidence of acute fracture, subluxation or prevertebral soft tissue swelling.  Mild degenerative disc disease and spondylosis is C5-C6 and C6-7 noted.  No focal bony lesions are present.  The soft tissue structures are within normal limits.  IMPRESSION: No evidence of acute intracranial abnormality.  No static evidence of acute injury  to the cervical spine.  Atrophy and chronic small-vessel white matter ischemic changes.  Mild degenerative disc disease and spondylosis from C5-C7.   Electronically Signed   By: Laveda Abbe M.D.   On: 07/07/2013 21:15   Ct Cervical Spine Wo Contrast  07/07/2013   CLINICAL DATA:  78 year old female with head and neck pain following fall.  EXAM: CT HEAD WITHOUT CONTRAST  CT CERVICAL SPINE WITHOUT CONTRAST  TECHNIQUE: Multidetector CT imaging of the head and cervical spine was performed following the standard protocol without intravenous contrast. Multiplanar CT image reconstructions of the cervical spine were also generated.  COMPARISON:  04/13/2013 CTs  FINDINGS: CT HEAD FINDINGS  Mild atrophy and moderate chronic small-vessel white matter ischemic changes are again noted.  No acute intracranial abnormalities are identified, including mass lesion or mass effect, hydrocephalus, extra-axial fluid collection, midline shift, hemorrhage, or acute infarction. The visualized bony calvarium is unremarkable.  CT CERVICAL  SPINE FINDINGS  Normal alignment is noted.  There is no evidence of acute fracture, subluxation or prevertebral soft tissue swelling.  Mild degenerative disc disease and spondylosis is C5-C6 and C6-7 noted.  No focal bony lesions are present.  The soft tissue structures are within normal limits.  IMPRESSION: No evidence of acute intracranial abnormality.  No static evidence of acute injury to the cervical spine.  Atrophy and chronic small-vessel white matter ischemic changes.  Mild degenerative disc disease and spondylosis from C5-C7.   Electronically Signed   By: Laveda Abbe M.D.   On: 07/07/2013 21:15   Mri Brain Without Contrast  07/08/2013   CLINICAL DATA:  TIA.  Recurrent falls.  EXAM: MRI HEAD WITHOUT CONTRAST  MRA HEAD WITHOUT CONTRAST  TECHNIQUE: Multiplanar, multiecho pulse sequences of the brain and surrounding structures were obtained without intravenous contrast. Angiographic images of the head were obtained using MRA technique without contrast.  COMPARISON:  CT head without contrast 07/07/2013. MRI brain 04/13/2013.  FINDINGS: MRI HEAD FINDINGS  A punctate nonhemorrhagic infarct is present within the medial superior right cerebellum. There is also a 9 mm nonhemorrhagic infarct within the white matter of the rib right parietal and temporal lobe. Atrophy and diffuse white matter disease is otherwise stable. There are remote lacunar infarcts involving the basal ganglia bilaterally. These are stable. White matter changes extend into the brainstem.  Flow is present in the major intracranial arteries. The patient is status post left lens extraction. The ventricles are proportionate to the degree of atrophy. No significant extra-axial fluid collection is present. Midline structures are within normal limits.  The paranasal sinuses and mastoid air cells are clear.  MRA HEAD FINDINGS  The internal carotid arteries are within normal limits from the high cervical segments through the ICA termini bilaterally. A 2  mm aneurysm is evident at the level of the left ophthalmic artery. There is asymmetric attenuation of the left A1 segment suggesting proximal stenosis. The right A1 segment and bilateral M1 segments are patent. The anterior communicating artery is patent. Bilateral distal small vessel disease is evident.  The right vertebral artery is dominant. The right PICA origin is visualized and normal. The left AICA is dominant. The basilar artery is within normal limits. Both posterior cerebral arteries originate from the basilar tip. There is some segmental attenuation in the posterior cerebral arteries bilaterally.  IMPRESSION: 1. Small linear nonhemorrhagic infarcts involving the medial superior right cerebellum and right temporoparietal white matter. 2. Otherwise stable atrophy and diffuse white matter disease. This likely reflects the sequelae of  chronic microvascular ischemia. 3. Stable remote lacunar infarcts of the basal ganglia. 4. 8 mm periophthalmic artery aneurysm. 5. Mild distal small vessel disease.   Electronically Signed   By: Gennette Pac M.D.   On: 07/08/2013 20:48   Mr Maxine Glenn Head/brain Wo Cm  07/08/2013   CLINICAL DATA:  TIA.  Recurrent falls.  EXAM: MRI HEAD WITHOUT CONTRAST  MRA HEAD WITHOUT CONTRAST  TECHNIQUE: Multiplanar, multiecho pulse sequences of the brain and surrounding structures were obtained without intravenous contrast. Angiographic images of the head were obtained using MRA technique without contrast.  COMPARISON:  CT head without contrast 07/07/2013. MRI brain 04/13/2013.  FINDINGS: MRI HEAD FINDINGS  A punctate nonhemorrhagic infarct is present within the medial superior right cerebellum. There is also a 9 mm nonhemorrhagic infarct within the white matter of the rib right parietal and temporal lobe. Atrophy and diffuse white matter disease is otherwise stable. There are remote lacunar infarcts involving the basal ganglia bilaterally. These are stable. White matter changes extend into  the brainstem.  Flow is present in the major intracranial arteries. The patient is status post left lens extraction. The ventricles are proportionate to the degree of atrophy. No significant extra-axial fluid collection is present. Midline structures are within normal limits.  The paranasal sinuses and mastoid air cells are clear.  MRA HEAD FINDINGS  The internal carotid arteries are within normal limits from the high cervical segments through the ICA termini bilaterally. A 2 mm aneurysm is evident at the level of the left ophthalmic artery. There is asymmetric attenuation of the left A1 segment suggesting proximal stenosis. The right A1 segment and bilateral M1 segments are patent. The anterior communicating artery is patent. Bilateral distal small vessel disease is evident.  The right vertebral artery is dominant. The right PICA origin is visualized and normal. The left AICA is dominant. The basilar artery is within normal limits. Both posterior cerebral arteries originate from the basilar tip. There is some segmental attenuation in the posterior cerebral arteries bilaterally.  IMPRESSION: 1. Small linear nonhemorrhagic infarcts involving the medial superior right cerebellum and right temporoparietal white matter. 2. Otherwise stable atrophy and diffuse white matter disease. This likely reflects the sequelae of chronic microvascular ischemia. 3. Stable remote lacunar infarcts of the basal ganglia. 4. 8 mm periophthalmic artery aneurysm. 5. Mild distal small vessel disease.   Electronically Signed   By: Gennette Pac M.D.   On: 07/08/2013 20:48    Microbiology: No results found for this or any previous visit (from the past 240 hour(s)).   Labs: Basic Metabolic Panel:  Recent Labs Lab 07/07/13 1906 07/08/13 0555  NA 136 138  K 3.6 3.3*  CL 103 104  CO2 24 22  GLUCOSE 93 88  BUN 14 11  CREATININE 0.80 0.79  CALCIUM 8.6 8.9   Liver Function Tests:  Recent Labs Lab 07/07/13 1906  07/08/13 0555  AST 15 14  ALT 12 13  ALKPHOS 48 51  BILITOT 0.3 0.5  PROT 6.0 6.3  ALBUMIN 3.3* 3.4*   No results found for this basename: LIPASE, AMYLASE,  in the last 168 hours No results found for this basename: AMMONIA,  in the last 168 hours CBC:  Recent Labs Lab 07/07/13 1906 07/08/13 0555  WBC 12.6* 9.7  NEUTROABS  --  5.3  HGB 11.1* 11.9*  HCT 33.8* 34.6*  MCV 93.6 91.1  PLT 204 227   Cardiac Enzymes:  Recent Labs Lab 07/07/13 1906 07/08/13 0555  TROPONINI <0.30 <0.30  BNP: BNP (last 3 results) No results found for this basename: PROBNP,  in the last 8760 hours CBG:  Recent Labs Lab 07/08/13 0643 07/10/13 1319  GLUCAP 88 91       Signed:  Tykeria Wawrzyniak  [facility] 07/10/2013, 4:15 PM

## 2013-07-10 NOTE — Progress Notes (Signed)
Patient ready for discharge, assessments remain unchanged. Discharge instructions and prescriptions given. All questions answered for her and husband.

## 2013-07-10 NOTE — Progress Notes (Signed)
Patient came back from Endo fully alert and oriented. She is stable in bed. Will continue to monitor.

## 2013-07-10 NOTE — Progress Notes (Signed)
Echocardiogram Echocardiogram Transesophageal has been performed.  Ronika Kelson 07/10/2013, 3:51 PM

## 2013-07-10 NOTE — H&P (View-Only) (Signed)
Referring Physician: Dhungel    Chief Complaint: Slurred speech after fall  HPI: Kim Blevins is an 78 y.o. female with a history of recurrent falls.  ON the day of admission around 10 AM the patient was in house and she had a fall. It was not witnessed by her husband who heard a thud and when he came to the living room he found her on the ground close to her chair. He was able to stand her up and she was not confused but after lunch she wanted to sleep.  When she awakened about 4pm, she was having slurred speech, was confused and not oriented to time or place. She was able to identify her husband.  At that time she was brought to the ED for evaluation.  Although the patient has had falls in the past her confusion and slurred speech were not typical for her.  It is unclear how the patient fell.  Patient is amnestic of the event.  Date last known well: Date: 07/07/2013 Time last known well: Time: 10:00 tPA Given: No: Outside time window  Past Medical History  Diagnosis Date  . Hypertension   . GERD (gastroesophageal reflux disease)     Past Surgical History  Procedure Laterality Date  . Appendectomy      Family history: No family history of stroke  Social History:  reports that she has never smoked. She does not have any smokeless tobacco history on file. She reports that she does not drink alcohol. Her drug history is not on file.  Allergies:  Allergies  Allergen Reactions  . Plaquenil [Hydroxychloroquine Sulfate] Rash    Medications:  I have reviewed the patient's current medications. Prior to Admission:  Prescriptions prior to admission  Medication Sig Dispense Refill  . aspirin 325 MG tablet Take 325 mg by mouth daily.      . butalbital-acetaminophen-caffeine (FIORICET, ESGIC) 50-325-40 MG per tablet Take 2 tablets by mouth every 6 (six) hours as needed for headache. For headache      . famotidine (PEPCID) 40 MG tablet Take 40 mg by mouth 3 (three) times daily.      .  lisinopril (PRINIVIL,ZESTRIL) 20 MG tablet Take 20 mg by mouth daily.      . omeprazole (PRILOSEC) 20 MG capsule Take 20 mg by mouth daily.      . oxyCODONE-acetaminophen (PERCOCET) 7.5-325 MG per tablet Take 1 tablet by mouth every 6 (six) hours as needed for pain. For pain      . [DISCONTINUED] zolpidem (AMBIEN) 10 MG tablet Take 10 mg by mouth at bedtime as needed for sleep.       Scheduled: . [START ON 07/10/2013] clopidogrel  75 mg Oral Q breakfast  . enoxaparin (LOVENOX) injection  40 mg Subcutaneous Q24H  . famotidine  40 mg Oral TID  . lisinopril  20 mg Oral Daily  . pantoprazole  40 mg Oral Daily    ROS: History obtained from the patient  General ROS: negative for - chills, fatigue, fever, night sweats, weight gain or weight loss Psychological ROS: negative for - behavioral disorder, hallucinations, memory difficulties, mood swings or suicidal ideation Ophthalmic ROS: negative for - blurry vision, double vision, eye pain or loss of vision ENT ROS: negative for - epistaxis, nasal discharge, oral lesions, sore throat, tinnitus or vertigo Allergy and Immunology ROS: negative for - hives or itchy/watery eyes Hematological and Lymphatic ROS: bruise on forehead Endocrine ROS: negative for - galactorrhea, hair pattern changes, polydipsia/polyuria or   temperature intolerance Respiratory ROS: negative for - cough, hemoptysis, shortness of breath or wheezing Cardiovascular ROS: negative for - chest pain, dyspnea on exertion, edema or irregular heartbeat Gastrointestinal ROS: negative for - abdominal pain, diarrhea, hematemesis, nausea/vomiting or stool incontinence Genito-Urinary ROS: negative for - dysuria, hematuria, incontinence or urinary frequency/urgency Musculoskeletal ROS: back pain Neurological ROS: as noted in HPI Dermatological ROS: negative for rash and skin lesion changes  Physical Examination: Blood pressure 142/72, pulse 72, temperature 97.7 F (36.5 C), temperature  source Oral, resp. rate 20, height 5' 3" (1.6 m), weight 52.7 kg (116 lb 2.9 oz), SpO2 96.00%.  Neurologic Examination: Mental Status: Alert, oriented, thought content appropriate.  Speech fluent without evidence of aphasia.  Able to follow 3 step commands but requires some reinforcement. Cranial Nerves: II: Discs flat bilaterally; Visual fields grossly normal, pupils equal, round, reactive to light and accommodation III,IV, VI: ptosis not present, extra-ocular motions intact bilaterally V,VII: smile symmetric, facial light touch sensation normal bilaterally VIII: hearing normal bilaterally IX,X: gag reflex present XI: bilateral shoulder shrug XII: midline tongue extension Motor: Right : Upper extremity   5/5 with pronator drift   Left:     Upper extremity   5/5  Lower extremity   5/5       Lower extremity   5/5 Tone and bulk:normal tone throughout; no atrophy noted Sensory: Pinprick and light touch intact throughout, bilaterally Deep Tendon Reflexes: 2+ and symmetric throughout with 1+ AJ's bilaterally Plantars: Right: downgoing   Left: downgoing Cerebellar: normal finger-to-nose and normal heel-to-shin test Gait: narrow based CV: pulses palpable throughout    Laboratory Studies:  Basic Metabolic Panel:  Recent Labs Lab 07/07/13 1906 07/08/13 0555  NA 136 138  K 3.6 3.3*  CL 103 104  CO2 24 22  GLUCOSE 93 88  BUN 14 11  CREATININE 0.80 0.79  CALCIUM 8.6 8.9    Liver Function Tests:  Recent Labs Lab 07/07/13 1906 07/08/13 0555  AST 15 14  ALT 12 13  ALKPHOS 48 51  BILITOT 0.3 0.5  PROT 6.0 6.3  ALBUMIN 3.3* 3.4*   No results found for this basename: LIPASE, AMYLASE,  in the last 168 hours No results found for this basename: AMMONIA,  in the last 168 hours  CBC:  Recent Labs Lab 07/07/13 1906 07/08/13 0555  WBC 12.6* 9.7  NEUTROABS  --  5.3  HGB 11.1* 11.9*  HCT 33.8* 34.6*  MCV 93.6 91.1  PLT 204 227    Cardiac Enzymes:  Recent Labs Lab  07/07/13 1906 07/08/13 0555  TROPONINI <0.30 <0.30    BNP: No components found with this basename: POCBNP,   CBG:  Recent Labs Lab 07/08/13 0643  GLUCAP 88    Microbiology: No results found for this or any previous visit.  Coagulation Studies:  Recent Labs  07/07/13 1910  LABPROT 13.1  INR 1.01    Urinalysis:  Recent Labs Lab 07/07/13 1914 07/08/13 1730  COLORURINE YELLOW AMBER*  LABSPEC 1.015 1.040*  PHURINE 7.0 5.5  GLUCOSEU NEGATIVE NEGATIVE  HGBUR TRACE* SMALL*  BILIRUBINUR NEGATIVE SMALL*  KETONESUR NEGATIVE 15*  PROTEINUR NEGATIVE NEGATIVE  UROBILINOGEN 0.2 0.2  NITRITE NEGATIVE NEGATIVE  LEUKOCYTESUR NEGATIVE TRACE*    Lipid Panel:    Component Value Date/Time   CHOL 174 07/08/2013 0555   TRIG 62 07/08/2013 0555   HDL 68 07/08/2013 0555   CHOLHDL 2.6 07/08/2013 0555   VLDL 12 07/08/2013 0555   LDLCALC 94 07/08/2013 0555      HgbA1C:  Lab Results  Component Value Date   HGBA1C 5.5 07/08/2013    Urine Drug Screen:   No results found for this basename: labopia, cocainscrnur, labbenz, amphetmu, thcu, labbarb    Alcohol Level: No results found for this basename: ETH,  in the last 168 hours  Other results: EKG: sinus rhythm at 61 bpm.  Imaging: Dg Chest 2 View  07/07/2013   CLINICAL DATA:  Dizziness.  History of fall.  EXAM: CHEST  2 VIEW  COMPARISON:  Chest x-ray 04/13/2013.  FINDINGS: There are some irregular opacity is in the region of the left lower lobe, which could represent a developing bronchopneumonia or sequela of mild aspiration. No pleural effusions. No evidence of pulmonary edema. No pneumothorax. Heart size is normal. Mediastinal contours are within normal limits. Atherosclerosis in the thoracic aorta.  IMPRESSION: 1. Irregular opacities in the left lower lobe may reflect developing bronchopneumonia or sequela of mild aspiration. 2. No definite findings to suggest significant acute traumatic injury to the thorax on today's plain  film examination.   Electronically Signed   By: Daniel  Entrikin M.D.   On: 07/07/2013 20:29   Ct Head Wo Contrast  07/07/2013   CLINICAL DATA:  78-year-old female with head and neck pain following fall.  EXAM: CT HEAD WITHOUT CONTRAST  CT CERVICAL SPINE WITHOUT CONTRAST  TECHNIQUE: Multidetector CT imaging of the head and cervical spine was performed following the standard protocol without intravenous contrast. Multiplanar CT image reconstructions of the cervical spine were also generated.  COMPARISON:  04/13/2013 CTs  FINDINGS: CT HEAD FINDINGS  Mild atrophy and moderate chronic small-vessel white matter ischemic changes are again noted.  No acute intracranial abnormalities are identified, including mass lesion or mass effect, hydrocephalus, extra-axial fluid collection, midline shift, hemorrhage, or acute infarction. The visualized bony calvarium is unremarkable.  CT CERVICAL SPINE FINDINGS  Normal alignment is noted.  There is no evidence of acute fracture, subluxation or prevertebral soft tissue swelling.  Mild degenerative disc disease and spondylosis is C5-C6 and C6-7 noted.  No focal bony lesions are present.  The soft tissue structures are within normal limits.  IMPRESSION: No evidence of acute intracranial abnormality.  No static evidence of acute injury to the cervical spine.  Atrophy and chronic small-vessel white matter ischemic changes.  Mild degenerative disc disease and spondylosis from C5-C7.   Electronically Signed   By: Jeff  Hu M.D.   On: 07/07/2013 21:15   Ct Cervical Spine Wo Contrast  07/07/2013   CLINICAL DATA:  78-year-old female with head and neck pain following fall.  EXAM: CT HEAD WITHOUT CONTRAST  CT CERVICAL SPINE WITHOUT CONTRAST  TECHNIQUE: Multidetector CT imaging of the head and cervical spine was performed following the standard protocol without intravenous contrast. Multiplanar CT image reconstructions of the cervical spine were also generated.  COMPARISON:  04/13/2013 CTs   FINDINGS: CT HEAD FINDINGS  Mild atrophy and moderate chronic small-vessel white matter ischemic changes are again noted.  No acute intracranial abnormalities are identified, including mass lesion or mass effect, hydrocephalus, extra-axial fluid collection, midline shift, hemorrhage, or acute infarction. The visualized bony calvarium is unremarkable.  CT CERVICAL SPINE FINDINGS  Normal alignment is noted.  There is no evidence of acute fracture, subluxation or prevertebral soft tissue swelling.  Mild degenerative disc disease and spondylosis is C5-C6 and C6-7 noted.  No focal bony lesions are present.  The soft tissue structures are within normal limits.  IMPRESSION: No evidence of acute intracranial abnormality.    No static evidence of acute injury to the cervical spine.  Atrophy and chronic small-vessel white matter ischemic changes.  Mild degenerative disc disease and spondylosis from C5-C7.   Electronically Signed   By: Jeff  Hu M.D.   On: 07/07/2013 21:15   Mri Brain Without Contrast  07/08/2013   CLINICAL DATA:  TIA.  Recurrent falls.  EXAM: MRI HEAD WITHOUT CONTRAST  MRA HEAD WITHOUT CONTRAST  TECHNIQUE: Multiplanar, multiecho pulse sequences of the brain and surrounding structures were obtained without intravenous contrast. Angiographic images of the head were obtained using MRA technique without contrast.  COMPARISON:  CT head without contrast 07/07/2013. MRI brain 04/13/2013.  FINDINGS: MRI HEAD FINDINGS  A punctate nonhemorrhagic infarct is present within the medial superior right cerebellum. There is also a 9 mm nonhemorrhagic infarct within the white matter of the rib right parietal and temporal lobe. Atrophy and diffuse white matter disease is otherwise stable. There are remote lacunar infarcts involving the basal ganglia bilaterally. These are stable. White matter changes extend into the brainstem.  Flow is present in the major intracranial arteries. The patient is status post left lens  extraction. The ventricles are proportionate to the degree of atrophy. No significant extra-axial fluid collection is present. Midline structures are within normal limits.  The paranasal sinuses and mastoid air cells are clear.  MRA HEAD FINDINGS  The internal carotid arteries are within normal limits from the high cervical segments through the ICA termini bilaterally. A 2 mm aneurysm is evident at the level of the left ophthalmic artery. There is asymmetric attenuation of the left A1 segment suggesting proximal stenosis. The right A1 segment and bilateral M1 segments are patent. The anterior communicating artery is patent. Bilateral distal small vessel disease is evident.  The right vertebral artery is dominant. The right PICA origin is visualized and normal. The left AICA is dominant. The basilar artery is within normal limits. Both posterior cerebral arteries originate from the basilar tip. There is some segmental attenuation in the posterior cerebral arteries bilaterally.  IMPRESSION: 1. Small linear nonhemorrhagic infarcts involving the medial superior right cerebellum and right temporoparietal white matter. 2. Otherwise stable atrophy and diffuse white matter disease. This likely reflects the sequelae of chronic microvascular ischemia. 3. Stable remote lacunar infarcts of the basal ganglia. 4. 8 mm periophthalmic artery aneurysm. 5. Mild distal small vessel disease.   Electronically Signed   By: Chris  Mattern M.D.   On: 07/08/2013 20:48   Mr Mra Head/brain Wo Cm  07/08/2013   CLINICAL DATA:  TIA.  Recurrent falls.  EXAM: MRI HEAD WITHOUT CONTRAST  MRA HEAD WITHOUT CONTRAST  TECHNIQUE: Multiplanar, multiecho pulse sequences of the brain and surrounding structures were obtained without intravenous contrast. Angiographic images of the head were obtained using MRA technique without contrast.  COMPARISON:  CT head without contrast 07/07/2013. MRI brain 04/13/2013.  FINDINGS: MRI HEAD FINDINGS  A punctate  nonhemorrhagic infarct is present within the medial superior right cerebellum. There is also a 9 mm nonhemorrhagic infarct within the white matter of the rib right parietal and temporal lobe. Atrophy and diffuse white matter disease is otherwise stable. There are remote lacunar infarcts involving the basal ganglia bilaterally. These are stable. White matter changes extend into the brainstem.  Flow is present in the major intracranial arteries. The patient is status post left lens extraction. The ventricles are proportionate to the degree of atrophy. No significant extra-axial fluid collection is present. Midline structures are within normal limits.  The paranasal   sinuses and mastoid air cells are clear.  MRA HEAD FINDINGS  The internal carotid arteries are within normal limits from the high cervical segments through the ICA termini bilaterally. A 2 mm aneurysm is evident at the level of the left ophthalmic artery. There is asymmetric attenuation of the left A1 segment suggesting proximal stenosis. The right A1 segment and bilateral M1 segments are patent. The anterior communicating artery is patent. Bilateral distal small vessel disease is evident.  The right vertebral artery is dominant. The right PICA origin is visualized and normal. The left AICA is dominant. The basilar artery is within normal limits. Both posterior cerebral arteries originate from the basilar tip. There is some segmental attenuation in the posterior cerebral arteries bilaterally.  IMPRESSION: 1. Small linear nonhemorrhagic infarcts involving the medial superior right cerebellum and right temporoparietal white matter. 2. Otherwise stable atrophy and diffuse white matter disease. This likely reflects the sequelae of chronic microvascular ischemia. 3. Stable remote lacunar infarcts of the basal ganglia. 4. 8 mm periophthalmic artery aneurysm. 5. Mild distal small vessel disease.   Electronically Signed   By: Chris  Mattern M.D.   On: 07/08/2013  20:48    Assessment: 78 y.o. female presenting after a fall with slurred speech and confusion that has now resolved. . MRI reviewed and shows a small acute in the right cerebellum and right temporoparietal white matter.  Patient on ASA at home.  Has been changed to Plavix.  Echocardiogram and carotid dopplers are unremarkable.  A1c 5.5 and LDL 94.   With more than one vascular distribution affected concerned about cardiac etiology.  Further work up recommended.  Stroke Risk Factors - hypertension  Plan: 1. TEE 2. Prophylactic therapy-Continue Plavix 3. Telemetry monitoring 4. Frequent neuro checks  Case discussed with Dr. Dhungel  Dagon Budai, MD [facility] [phone] 07/09/2013, 3:56 PM      

## 2013-07-10 NOTE — Progress Notes (Signed)
Stroke Team Progress Note  HISTORY HPI: TACI Kim Blevins is an 78 y.o. female with a history of recurrent falls. ON the day of admission around 10 AM the patient was in house and she had a fall. It was not witnessed by her husband who heard a thud and when he came to the living room he found her on the ground close to her chair. He was able to stand her up and she was not confused but after lunch she wanted to sleep. When she awakened about 4pm, she was having slurred speech, was confused and not oriented to time or place. She was able to identify her husband. At that time she was brought to the ED for evaluation. Although the patient has had falls in the past her confusion and slurred speech were not typical for her. It is unclear how the patient fell. Patient is amnestic of the event.   Date last known well: Date: 07/07/2013  Time last known well: Time: 10:00  tPA Given: No: Outside time window    SUBJECTIVE There are no family members present this morning. The patient is feeling better. She is for a TEE later today  OBJECTIVE Most recent Vital Signs: Filed Vitals:   07/10/13 1525 07/10/13 1530 07/10/13 1535 07/10/13 1540  BP: 176/76 208/68 189/78 189/78  Pulse: 75 82 79 72  Temp:   97.2 F (36.2 C)   TempSrc:   Oral   Resp: 21 14 13 17   Height:      Weight:      SpO2:       CBG (last 3)   Recent Labs  07/08/13 0643 07/10/13 1319  GLUCAP 88 91    IV Fluid Intake:     MEDICATIONS  . clopidogrel  75 mg Oral Q breakfast  . enoxaparin (LOVENOX) injection  40 mg Subcutaneous Q24H  . famotidine  40 mg Oral TID  . lisinopril  20 mg Oral Daily  . pantoprazole  40 mg Oral Daily   PRN:  butalbital-acetaminophen-caffeine, oxyCODONE, zolpidem  Diet:  NPO no liquids Activity:   Bathroom with assistance DVT Prophylaxis:  Lovenox  CLINICALLY SIGNIFICANT STUDIES Basic Metabolic Panel:   Recent Labs Lab 07/07/13 1906 07/08/13 0555  NA 136 138  K 3.6 3.3*  CL 103 104  CO2  24 22  GLUCOSE 93 88  BUN 14 11  CREATININE 0.80 0.79  CALCIUM 8.6 8.9   Liver Function Tests:   Recent Labs Lab 07/07/13 1906 07/08/13 0555  AST 15 14  ALT 12 13  ALKPHOS 48 51  BILITOT 0.3 0.5  PROT 6.0 6.3  ALBUMIN 3.3* 3.4*   CBC:   Recent Labs Lab 07/07/13 1906 07/08/13 0555  WBC 12.6* 9.7  NEUTROABS  --  5.3  HGB 11.1* 11.9*  HCT 33.8* 34.6*  MCV 93.6 91.1  PLT 204 227   Coagulation:   Recent Labs Lab 07/07/13 1910  LABPROT 13.1  INR 1.01   Cardiac Enzymes:   Recent Labs Lab 07/07/13 1906 07/08/13 0555  TROPONINI <0.30 <0.30   Urinalysis:   Recent Labs Lab 07/07/13 1914 07/08/13 1730  COLORURINE YELLOW AMBER*  LABSPEC 1.015 1.040*  PHURINE 7.0 5.5  GLUCOSEU NEGATIVE NEGATIVE  HGBUR TRACE* SMALL*  BILIRUBINUR NEGATIVE SMALL*  KETONESUR NEGATIVE 15*  PROTEINUR NEGATIVE NEGATIVE  UROBILINOGEN 0.2 0.2  NITRITE NEGATIVE NEGATIVE  LEUKOCYTESUR NEGATIVE TRACE*   Lipid Panel    Component Value Date/Time   CHOL 174 07/08/2013 0555   TRIG 62  07/08/2013 0555   HDL 68 07/08/2013 0555   CHOLHDL 2.6 07/08/2013 0555   VLDL 12 07/08/2013 0555   LDLCALC 94 07/08/2013 0555   HgbA1C  Lab Results  Component Value Date   HGBA1C 5.5 07/08/2013    Urine Drug Screen:   No results found for this basename: labopia,  cocainscrnur,  labbenz,  amphetmu,  thcu,  labbarb    Alcohol Level: No results found for this basename: ETH,  in the last 168 hours  Mri Brain Without Contrast 07/08/2013    1. Small linear nonhemorrhagic infarcts involving the medial superior right cerebellum and right temporoparietal white matter. 2. Otherwise stable atrophy and diffuse white matter disease. This likely reflects the sequelae of chronic microvascular ischemia. 3. Stable remote lacunar infarcts of the basal ganglia. 4. 8 mm periophthalmic artery aneurysm. 5. Mild distal small vessel disease.      CT of the brain   Mild atrophy and moderate chronic small-vessel  white matter ischemic  changes are again noted.  No acute intracranial abnormalities are identified, including mass  lesion or mass effect, hydrocephalus, extra-axial fluid collection,  midline shift, hemorrhage, or acute infarction. The visualized bony  calvarium is unremarkable.  2D Echocardiogram  ejection fraction 60-65%. No cardiac source of emboli identified  Carotid Doppler  Bilateral: mild mixed plaque CCA andoriginand proximal ICA and ECA.1-39% ICA stenosis. Vertebral artery flow is antegrade.   CXR    EKG  sinus rhythm rate 61 beats per minute  Therapy Recommendations no followup recommended  PhNeurologic Examination:  Mental Status:  Alert, oriented, thought content appropriate. Speech fluent without evidence of aphasia. Able to follow 3 step commands but requires some reinforcement.  Cranial Nerves:  II: Discs flat bilaterally; Visual fields grossly normal, pupils equal, round, reactive to light and accommodation  III,IV, VI: ptosis not present, extra-ocular motions intact bilaterally  V,VII: smile symmetric, facial light touch sensation normal bilaterally  VIII: hearing normal bilaterally  IX,X: gag reflex present  XI: bilateral shoulder shrug  XII: midline tongue extension  Motor:  Right : Upper extremity 5/5 with pronator drift Left: Upper extremity 5/5  Lower extremity 5/5 Lower extremity 5/5  Tone and bulk:normal tone throughout; no atrophy noted  Sensory: Pinprick and light touch intact throughout, bilaterally  Deep Tendon Reflexes: 2+ and symmetric throughout with 1+ AJ's bilaterally  Plantars:  Right: downgoing Left: downgoing  Cerebellar:  normal finger-to-nose and normal heel-to-shin test  Gait: narrow based  CV: pulses palpable throughout  ysical Exam    ASSESSMENT Kim Blevins is a 78 y.o. female presenting with recurrent falls. No t-PA was given as the patient was outside of the time window.. Imaging confirmsSmall linear nonhemorrhagic  infarcts involving the medial superior right cerebellum and right temporoparietal white matter. Infarcts felt to be embolic secondary to unknown etiology. On aspirin 325 mg orally every day prior to admission. Now on clopidogrel 75 mg orally every day for secondary stroke prevention. Patient with resultant resolution of deficits. Work up underway.   Hypertension  Hospital day # 3  TREATMENT/PLAN  Continue clopidogrel 75 mg orally every day for secondary stroke prevention.  Await TEE  Delton See PA-C [facility] Pager [phone] 07/10/2013, 3:49 PM  I have personally obtained a history, examined the patient, evaluated imaging results, and formulated the assessment and plan of care. I agree with the above. Delia Heady, MD

## 2013-07-10 NOTE — CV Procedure (Signed)
    Transesophageal Echocardiogram Note  Kim Blevins [id number] [birth year]  Procedure: Transesophageal Echocardiogram Indications: CVA  Procedure Details Consent: Obtained Time Out: Verified patient identification, verified procedure, site/side was marked, verified correct patient position, special equipment/implants available, Radiology Safety Procedures followed,  medications/allergies/relevent history reviewed, required imaging and test results available.  Performed  Medications: Fentanyl: 50 mcg iv Versed: 4 mg iv  Left Ventrical:  Well preserved LV function  Mitral Valve: structurally normal.  Trace MR  Aortic Valve: normal AV  Tricuspid Valve: normal TV  Pulmonic Valve: normal  Left Atrium/ Left atrial appendage: normal, no thrombi,  Atrial septum: very mobile (aneurismal),  No definite ASD or PFO.  There was a late right to left shunting during bubble study suggest either fenestrated atrial septum or pulmonary AVM An aneurismal atrial septum does not need any further follow up.  Aorta: mild calcification   Complications: No apparent complications Patient did tolerate procedure well.   Kim Blevins, Kim Blevins., MD, [facility] 07/10/2013, 3:32 PM

## 2013-07-10 NOTE — Progress Notes (Signed)
Physical Therapy Treatment Patient Details Name: Kim Blevins MRN: [id number] DOB: Nov 04, 1934 Today's Date: 07/10/2013 Time: 4098-1191 PT Time Calculation (min): 15 min  PT Assessment / Plan / Recommendation  History of Present Illness Today around 10 AM the patient was in house and she had a fall. It was not witnessed by her husband heard a thud and when he came to the living room he found her on the ground close to her chair. He was able to stand her up and she was not confused but after lunch she wanted to sleep and was not waking up until 4pm, as her husband woke her up. At that time she was having slurred speech, she was confused and not oriented to time or place. She was able to identify her husband. And therefore he brought her to the hospital.   PT Comments   Patient continues to demonstrate improvements in functional mobility. Ambulation steady today, no signs of fatigue or instability. Feel patient will be safe for dc home when medically ready.  Follow Up Recommendations  Supervision - Intermittent;Outpatient PT           Equipment Recommendations  None recommended by PT       Frequency Min 4X/week   Progress towards PT Goals Progress towards PT goals: Progressing toward goals  Plan Current plan remains appropriate    Precautions / Restrictions Precautions Precautions: Fall Restrictions Weight Bearing Restrictions: No   Pertinent Vitals/Pain No pain today    Mobility  Bed Mobility Bed Mobility: Supine to Sit Supine to Sit: 6: Modified independent (Device/Increase time) Sit to Supine: 6: Modified independent (Device/Increase time) Transfers Transfers: Sit to Stand;Stand to Sit Sit to Stand: 5: Supervision;From bed;From toilet Stand to Sit: 5: Supervision;To chair/3-in-1;To toilet Details for Transfer Assistance: Supervision for safety. Cue for technique for toilet transfer. Ambulation/Gait Ambulation/Gait Assistance: 5: Supervision;4: Min guard Ambulation  Distance (Feet): 620 Feet Assistive device: None Ambulation/Gait Assistance Details: ambulating without instability today Gait Pattern: Step-through pattern;Decreased stride length;Narrow base of support Gait velocity: decreased- except when performing dynamic gait and asked to "walk as fast as you can", pt was able to incr gait speed. General Gait Details: improved stability today      PT Goals (current goals can now be found in the care plan section) Acute Rehab PT Goals Patient Stated Goal: go home PT Goal Formulation: With patient Time For Goal Achievement: 07/22/13 Potential to Achieve Goals: Fair  Visit Information  Last PT Received On: 07/10/13 Assistance Needed: +1 History of Present Illness: Today around 10 AM the patient was in house and she had a fall. It was not witnessed by her husband heard a thud and when he came to the living room he found her on the ground close to her chair. He was able to stand her up and she was not confused but after lunch she wanted to sleep and was not waking up until 4pm, as her husband woke her up. At that time she was having slurred speech, she was confused and not oriented to time or place. She was able to identify her husband. And therefore he brought her to the hospital.    Subjective Data  Subjective: I am just waiting on this test so I can go home Patient Stated Goal: go home   Cognition  Cognition Arousal/Alertness: Awake/alert Behavior During Therapy: WFL for tasks assessed/performed Overall Cognitive Status: Within Functional Limits for tasks assessed    Balance   Steady with activities  End of  Session PT - End of Session Equipment Utilized During Treatment: Gait belt Activity Tolerance: Patient tolerated treatment well Patient left: in bed;with call bell/phone within reach Nurse Communication: Mobility status   GP     Fabio Asa 07/10/2013, 8:38 AM Charlotte Crumb, PT DPT  [phone]

## 2013-07-11 ENCOUNTER — Encounter (HOSPITAL_COMMUNITY): Payer: Self-pay | Admitting: Cardiovascular Disease

## 2013-07-11 NOTE — Progress Notes (Signed)
Patient discharged home prior to Outpatient services arranged. CM called patient at home to offer Outpatient physical therapy- patient is agreeable and patient wanted a rehab close to her home; Patient is agreeable to go to [facility] for therapy; CM talked to [facility] at the rehab center/ clinical information and orders faxed as requested; Alexis Goodell [phone]

## 2013-08-20 ENCOUNTER — Emergency Department (HOSPITAL_COMMUNITY)
Admission: EM | Admit: 2013-08-20 | Discharge: 2013-08-20 | Disposition: A | Discharge status: Home / Self Care | Payer: Medicare Other | Attending: Emergency Medicine | Admitting: Emergency Medicine

## 2013-08-20 ENCOUNTER — Emergency Department (HOSPITAL_COMMUNITY): Payer: Medicare Other

## 2013-08-20 ENCOUNTER — Encounter (HOSPITAL_COMMUNITY): Payer: Self-pay | Admitting: Emergency Medicine

## 2013-08-20 DIAGNOSIS — E860 Dehydration: Secondary | ICD-10-CM | POA: Insufficient documentation

## 2013-08-20 DIAGNOSIS — Z8739 Personal history of other diseases of the musculoskeletal system and connective tissue: Secondary | ICD-10-CM | POA: Insufficient documentation

## 2013-08-20 DIAGNOSIS — K219 Gastro-esophageal reflux disease without esophagitis: Secondary | ICD-10-CM | POA: Insufficient documentation

## 2013-08-20 DIAGNOSIS — R51 Headache: Secondary | ICD-10-CM | POA: Insufficient documentation

## 2013-08-20 DIAGNOSIS — G459 Transient cerebral ischemic attack, unspecified: Secondary | ICD-10-CM | POA: Insufficient documentation

## 2013-08-20 DIAGNOSIS — I10 Essential (primary) hypertension: Secondary | ICD-10-CM | POA: Insufficient documentation

## 2013-08-20 DIAGNOSIS — R42 Dizziness and giddiness: Secondary | ICD-10-CM | POA: Insufficient documentation

## 2013-08-20 DIAGNOSIS — R4701 Aphasia: Secondary | ICD-10-CM

## 2013-08-20 DIAGNOSIS — Z79899 Other long term (current) drug therapy: Secondary | ICD-10-CM | POA: Insufficient documentation

## 2013-08-20 DIAGNOSIS — Z7902 Long term (current) use of antithrombotics/antiplatelets: Secondary | ICD-10-CM | POA: Insufficient documentation

## 2013-08-20 HISTORY — DX: Systemic lupus erythematosus, unspecified: M32.9

## 2013-08-20 HISTORY — DX: Cerebral infarction, unspecified: I63.9

## 2013-08-20 HISTORY — DX: Reserved for concepts with insufficient information to code with codable children: IMO0002

## 2013-08-20 HISTORY — DX: Transient cerebral ischemic attack, unspecified: G45.9

## 2013-08-20 LAB — POCT I-STAT, CHEM 8
BUN: 8 mg/dL (ref 6–23)
CREATININE: 0.8 mg/dL (ref 0.50–1.10)
Calcium, Ion: 1.2 mmol/L (ref 1.13–1.30)
Chloride: 107 mEq/L (ref 96–112)
GLUCOSE: 95 mg/dL (ref 70–99)
HCT: 38 % (ref 36.0–46.0)
HEMOGLOBIN: 12.9 g/dL (ref 12.0–15.0)
POTASSIUM: 3.5 meq/L — AB (ref 3.7–5.3)
SODIUM: 144 meq/L (ref 137–147)
TCO2: 25 mmol/L (ref 0–100)

## 2013-08-20 LAB — GLUCOSE, CAPILLARY: Glucose-Capillary: 99 mg/dL (ref 70–99)

## 2013-08-20 LAB — CBC
HCT: 36.6 % (ref 36.0–46.0)
Hemoglobin: 12.1 g/dL (ref 12.0–15.0)
MCH: 30.3 pg (ref 26.0–34.0)
MCHC: 33.1 g/dL (ref 30.0–36.0)
MCV: 91.5 fL (ref 78.0–100.0)
PLATELETS: 195 10*3/uL (ref 150–400)
RBC: 4 MIL/uL (ref 3.87–5.11)
RDW: 14 % (ref 11.5–15.5)
WBC: 13.3 10*3/uL — AB (ref 4.0–10.5)

## 2013-08-20 LAB — BASIC METABOLIC PANEL
BUN: 9 mg/dL (ref 6–23)
CHLORIDE: 108 meq/L (ref 96–112)
CO2: 23 meq/L (ref 19–32)
CREATININE: 0.81 mg/dL (ref 0.50–1.10)
Calcium: 8.9 mg/dL (ref 8.4–10.5)
GFR calc Af Amer: 80 mL/min — ABNORMAL LOW (ref >90)
GFR calc non Af Amer: 69 mL/min — ABNORMAL LOW (ref >90)
GLUCOSE: 95 mg/dL (ref 70–99)
Potassium: 3.6 mEq/L — ABNORMAL LOW (ref 3.7–5.3)
Sodium: 144 mEq/L (ref 137–147)

## 2013-08-20 LAB — POCT I-STAT TROPONIN I: Troponin i, poc: 0.01 ng/mL (ref 0.00–0.08)

## 2013-08-20 MED ORDER — SODIUM CHLORIDE 0.9 % IV BOLUS (SEPSIS)
500.0000 mL | Freq: Once | INTRAVENOUS | Status: AC
  Administered 2013-08-20: 500 mL via INTRAVENOUS

## 2013-08-20 NOTE — ED Notes (Signed)
Patient transported to CT 

## 2013-08-20 NOTE — ED Provider Notes (Signed)
CSN: [id number]     Arrival date & time 08/20/13  1125 History   First MD Initiated Contact with Patient 08/20/13 1142     Chief Complaint  Patient presents with  . Aphasia   (Consider location/radiation/quality/duration/timing/severity/associated sxs/prior Treatment) HPI Comments: 79 yo female with HTN, Stroke, multiple TIA hx, on plavix presents after episode of dizziness and slurred speech upon awakening, resolved around 9 am.  Similar to previous.  Mild HA upon awakening that resolved, similar to previous.  No focal weakness.  Improved with time.  Pt has not missed medicines. Last stroke admission in December.    The history is provided by the patient.    Past Medical History  Diagnosis Date  . Hypertension   . GERD (gastroesophageal reflux disease)   . Stroke   . TIA (transient ischemic attack)   . Lupus    Past Surgical History  Procedure Laterality Date  . Appendectomy    . Esophagogastroduodenoscopy    . Tee without cardioversion N/A 07/10/2013    Procedure: TRANSESOPHAGEAL ECHOCARDIOGRAM (TEE);  Surgeon: Thayer Headings, MD;  Location: [facility] END[facility]COPY;  Service: Cardiovascular;  Laterality: N/A;   No family history on file. History  Substance Use Topics  . Smoking status: Never Smoker   . Smokeless tobacco: Not on file  . Alcohol Use: No   OB History   Grav Para Term Preterm Abortions TAB SAB Ect Mult Living                 Review of Systems  Constitutional: Negative for fever and chills.  HENT: Negative for congestion.   Eyes: Negative for visual disturbance.  Respiratory: Negative for shortness of breath.   Cardiovascular: Negative for chest pain.  Gastrointestinal: Negative for vomiting and abdominal pain.  Genitourinary: Negative for dysuria and flank pain.  Musculoskeletal: Negative for back pain, neck pain and neck stiffness.  Skin: Negative for rash.  Neurological: Positive for dizziness, light-headedness and headaches. Negative for weakness.     Allergies  Plaquenil  Home Medications   Current Outpatient Rx  Name  Route  Sig  Dispense  Refill  . atorvastatin (LIPITOR) 10 MG tablet   Oral   Take 10 mg by mouth daily.         . butalbital-acetaminophen-caffeine (FIORICET, ESGIC) 50-325-40 MG per tablet   Oral   Take 2 tablets by mouth every 6 (six) hours as needed for headache.          . Cholecalciferol (VITAMIN D-3 PO)   Oral   Take 1 capsule by mouth daily.         . clopidogrel (PLAVIX) 75 MG tablet   Oral   Take 75 mg by mouth daily with breakfast.         . famotidine (PEPCID) 40 MG tablet   Oral   Take 40 mg by mouth 3 (three) times daily.         Marland Kitchen lisinopril (PRINIVIL,ZESTRIL) 20 MG tablet   Oral   Take 20 mg by mouth daily.         . Multiple Vitamins-Minerals (MULTIVITAMIN PO)   Oral   Take 1 tablet by mouth daily.         . Omega-3 Fatty Acids (FISH OIL PO)   Oral   Take 1 capsule by mouth daily.         Marland Kitchen omeprazole (PRILOSEC) 20 MG capsule   Oral   Take 20 mg by mouth 2 (two) times daily.          Marland Kitchen  oxyCODONE-acetaminophen (PERCOCET) 7.5-325 MG per tablet   Oral   Take 1 tablet by mouth every 6 (six) hours as needed for pain.          Marland Kitchen zolpidem (AMBIEN) 10 MG tablet   Oral   Take 0.5 tablets (5 mg total) by mouth at bedtime as needed for sleep.   5 tablet   0    BP 103/84  Pulse 69  Temp(Src) 98 F (36.7 C) (Oral)  Resp 16  Ht 5\' 3"  (1.6 m)  Wt 118 lb (53.524 kg)  BMI 20.91 kg/m2  SpO2 98% Physical Exam  Nursing note and vitals reviewed. Constitutional: She is oriented to person, place, and time. She appears well-developed and well-nourished.  HENT:  Head: Normocephalic and atraumatic.  Mild dry mm  Eyes: Conjunctivae are normal. Right eye exhibits no discharge. Left eye exhibits no discharge.  Neck: Normal range of motion. Neck supple. No tracheal deviation present.  Cardiovascular: Normal rate and regular rhythm.   Pulmonary/Chest: Effort normal  and breath sounds normal.  Abdominal: Soft. She exhibits no distension. There is no tenderness. There is no guarding.  Musculoskeletal: She exhibits no edema.  Neurological: She is alert and oriented to person, place, and time. GCS eye subscore is 4. GCS verbal subscore is 5. GCS motor subscore is 6.  5+ strength in UE and LE with f/e at major joints. Sensation to palpation intact in UE and LE. CNs 2-12 grossly intact.  EOMFI.  PERRL.   Finger nose and coordination intact bilateral.   Visual fields intact to finger testing. Normal speech and language NIH zero in ED  Skin: Skin is warm. No rash noted.  Psychiatric: She has a normal mood and affect.    ED Course  Procedures (including critical care time) Labs Review Labs Reviewed  CBC - Abnormal; Notable for the following:    WBC 13.3 (*)    All other components within normal limits  POCT I-STAT, CHEM 8 - Abnormal; Notable for the following:    Potassium 3.5 (*)    All other components within normal limits  GLUCOSE, CAPILLARY  BASIC METABOLIC PANEL  POCT I-STAT TROPONIN I   Imaging Review Ct Head Wo Contrast  08/20/2013   CLINICAL DATA:  Aphasia.  Prior history of stroke.  EXAM: CT HEAD WITHOUT CONTRAST  TECHNIQUE: Contiguous axial images were obtained from the base of the skull through the vertex without intravenous contrast.  COMPARISON:  MR HEAD W/O CM dated 07/08/2013; MR MRA HEAD W/O CM dated 07/08/2013; CT HEAD W/O CM dated 07/07/2013; CT HEAD W/O CM dated 04/06/2013  FINDINGS: Ventricular system normal in size and appearance for age. Mild cerebellar atrophy, unchanged. Minimal cortical atrophy consistent with age, unchanged. Severe changes of small vessel disease of the white matter diffusely, including the brainstem, unchanged. No mass lesion. No midline shift. No acute hemorrhage or hematoma. No extra-axial fluid collections. No evidence of acute infarction.  Hyperostosis frontalis interna. All of the paranasal sinuses are well  aerated without evidence of mucous retention cyst or polyp, mucosal thickening, or air-fluid level. Bony nasal septum midline. Visualized mastoid air cells well aerated. Frontal sinuses hypoplastic. Mild carotid siphon atherosclerosis.  IMPRESSION: 1. No acute intracranial abnormality. 2. Stable severe chronic microvascular ischemic changes of the white matter.   Electronically Signed   By: Evangeline Dakin M.D.   On: 08/20/2013 13:28    EKG Interpretation    Date/Time:  Sunday August 20 2013 11:33:28 EST Ventricular Rate:  32  PR Interval:  147 QRS Duration: 72 QT Interval:  368 QTC Calculation: 422 R Axis:   -9 Text Interpretation:  Sinus rhythm Confirmed by Amberlin Utke  MD, Kia Varnadore ([id number]) on 08/20/2013 11:39:12 AM            MDM   1. Aphasia   2. TIA (transient ischemic attack)    Concern for recurrent TIA. Pt has had recent work up, on plavix.  Mild dehydration, fluids given IV.  With mild HA/ blood thinners CT head to rule out bleed, observation in ED, husband with pt.  Plan for likely close fup with pcp outpt and strict reasons to return.   Spoke with neuro on call, agreed that no indication for admission if CT okay. Filed Vitals:   08/20/13 1143 08/20/13 1200 08/20/13 1215 08/20/13 1230  BP: 106/46 103/84 112/51 128/56  Pulse: 79 69 68 65  Temp: 98 F (36.7 C)     TempSrc: Oral     Resp: 16 16 17 23   Height:      Weight:      SpO2: 100% 98% 100% 100%    Results and differential diagnosis were discussed with the patient. Close follow up outpatient was discussed, patient comfortable with the plan.         Mariea Clonts, MD 08/20/13 [phone]

## 2013-08-20 NOTE — ED Notes (Signed)
Received pt from home with c/o LSN last night. Pt woke up today feeling weakness. Husband reported to EMS that he feels her speech is slurred. Pt stroke screen negative for EMS. Pt reports weakness and unsteadiness resolved at present. Pt takes Plavix, has history of TIA and mini stroke in past.

## 2013-08-20 NOTE — Discharge Instructions (Signed)
Continue to take blood thinner as prescribed. Return to ER immediately for new headache or stroke symptoms.  If you were given medicines take as directed.  If you are on coumadin or contraceptives realize their levels and effectiveness is altered by many different medicines.  If you have any reaction (rash, tongues swelling, other) to the medicines stop taking and see a physician.   Please follow up as directed and return to the ER or see a physician for new or worsening symptoms.  Thank you.

## 2013-11-15 ENCOUNTER — Observation Stay (HOSPITAL_COMMUNITY): Payer: Medicare Other

## 2013-11-15 ENCOUNTER — Encounter (HOSPITAL_COMMUNITY): Payer: Self-pay | Admitting: Emergency Medicine

## 2013-11-15 ENCOUNTER — Observation Stay (HOSPITAL_COMMUNITY)
Admission: EM | Admit: 2013-11-15 | Discharge: 2013-11-17 | Disposition: A | Discharge status: Home / Self Care | Payer: Medicare Other | Attending: Internal Medicine | Admitting: Internal Medicine

## 2013-11-15 ENCOUNTER — Emergency Department (HOSPITAL_COMMUNITY): Payer: Medicare Other

## 2013-11-15 DIAGNOSIS — R5381 Other malaise: Principal | ICD-10-CM | POA: Insufficient documentation

## 2013-11-15 DIAGNOSIS — I517 Cardiomegaly: Secondary | ICD-10-CM

## 2013-11-15 DIAGNOSIS — E876 Hypokalemia: Secondary | ICD-10-CM

## 2013-11-15 DIAGNOSIS — M329 Systemic lupus erythematosus, unspecified: Secondary | ICD-10-CM | POA: Insufficient documentation

## 2013-11-15 DIAGNOSIS — I639 Cerebral infarction, unspecified: Secondary | ICD-10-CM

## 2013-11-15 DIAGNOSIS — I6350 Cerebral infarction due to unspecified occlusion or stenosis of unspecified cerebral artery: Secondary | ICD-10-CM

## 2013-11-15 DIAGNOSIS — I10 Essential (primary) hypertension: Secondary | ICD-10-CM | POA: Insufficient documentation

## 2013-11-15 DIAGNOSIS — W19XXXA Unspecified fall, initial encounter: Secondary | ICD-10-CM

## 2013-11-15 DIAGNOSIS — R410 Disorientation, unspecified: Secondary | ICD-10-CM | POA: Diagnosis present

## 2013-11-15 DIAGNOSIS — Z7902 Long term (current) use of antithrombotics/antiplatelets: Secondary | ICD-10-CM | POA: Insufficient documentation

## 2013-11-15 DIAGNOSIS — K219 Gastro-esophageal reflux disease without esophagitis: Secondary | ICD-10-CM | POA: Insufficient documentation

## 2013-11-15 DIAGNOSIS — R42 Dizziness and giddiness: Secondary | ICD-10-CM

## 2013-11-15 DIAGNOSIS — I6789 Other cerebrovascular disease: Secondary | ICD-10-CM | POA: Insufficient documentation

## 2013-11-15 DIAGNOSIS — R4789 Other speech disturbances: Secondary | ICD-10-CM | POA: Insufficient documentation

## 2013-11-15 DIAGNOSIS — R5383 Other fatigue: Principal | ICD-10-CM

## 2013-11-15 DIAGNOSIS — G459 Transient cerebral ischemic attack, unspecified: Secondary | ICD-10-CM

## 2013-11-15 DIAGNOSIS — R531 Weakness: Secondary | ICD-10-CM

## 2013-11-15 DIAGNOSIS — R4781 Slurred speech: Secondary | ICD-10-CM

## 2013-11-15 DIAGNOSIS — Z8673 Personal history of transient ischemic attack (TIA), and cerebral infarction without residual deficits: Secondary | ICD-10-CM | POA: Insufficient documentation

## 2013-11-15 DIAGNOSIS — R269 Unspecified abnormalities of gait and mobility: Secondary | ICD-10-CM | POA: Insufficient documentation

## 2013-11-15 DIAGNOSIS — F29 Unspecified psychosis not due to a substance or known physiological condition: Secondary | ICD-10-CM

## 2013-11-15 DIAGNOSIS — G894 Chronic pain syndrome: Secondary | ICD-10-CM

## 2013-11-15 LAB — COMPREHENSIVE METABOLIC PANEL
ALK PHOS: 67 U/L (ref 39–117)
ALT: 11 U/L (ref 0–35)
AST: 15 U/L (ref 0–37)
Albumin: 3.7 g/dL (ref 3.5–5.2)
BUN: 14 mg/dL (ref 6–23)
CO2: 23 mEq/L (ref 19–32)
Calcium: 9 mg/dL (ref 8.4–10.5)
Chloride: 105 mEq/L (ref 96–112)
Creatinine, Ser: 1.14 mg/dL — ABNORMAL HIGH (ref 0.50–1.10)
GFR, EST AFRICAN AMERICAN: 53 mL/min — AB (ref >90)
GFR, EST NON AFRICAN AMERICAN: 46 mL/min — AB (ref >90)
GLUCOSE: 105 mg/dL — AB (ref 70–99)
Potassium: 3.6 mEq/L — ABNORMAL LOW (ref 3.7–5.3)
Sodium: 141 mEq/L (ref 137–147)
Total Bilirubin: 0.3 mg/dL (ref 0.3–1.2)
Total Protein: 6.6 g/dL (ref 6.0–8.3)

## 2013-11-15 LAB — CBG MONITORING, ED: GLUCOSE-CAPILLARY: 102 mg/dL — AB (ref 70–99)

## 2013-11-15 LAB — CBC
HEMATOCRIT: 33.5 % — AB (ref 36.0–46.0)
HEMOGLOBIN: 10.8 g/dL — AB (ref 12.0–15.0)
MCH: 29 pg (ref 26.0–34.0)
MCHC: 32.2 g/dL (ref 30.0–36.0)
MCV: 89.8 fL (ref 78.0–100.0)
Platelets: 207 10*3/uL (ref 150–400)
RBC: 3.73 MIL/uL — ABNORMAL LOW (ref 3.87–5.11)
RDW: 14.2 % (ref 11.5–15.5)
WBC: 7.4 10*3/uL (ref 4.0–10.5)

## 2013-11-15 LAB — LIPID PANEL
CHOL/HDL RATIO: 2.2 ratio
Cholesterol: 146 mg/dL (ref 0–200)
HDL: 66 mg/dL (ref >39)
LDL Cholesterol: 69 mg/dL (ref 0–99)
Triglycerides: 55 mg/dL (ref <150)
VLDL: 11 mg/dL (ref 0–40)

## 2013-11-15 LAB — URINALYSIS, ROUTINE W REFLEX MICROSCOPIC
Bilirubin Urine: NEGATIVE
Glucose, UA: NEGATIVE mg/dL
Hgb urine dipstick: NEGATIVE
KETONES UR: NEGATIVE mg/dL
Leukocytes, UA: NEGATIVE
NITRITE: NEGATIVE
PH: 7 (ref 5.0–8.0)
PROTEIN: NEGATIVE mg/dL
Specific Gravity, Urine: 1.012 (ref 1.005–1.030)
UROBILINOGEN UA: 0.2 mg/dL (ref 0.0–1.0)

## 2013-11-15 LAB — PROTIME-INR
INR: 0.98 (ref 0.00–1.49)
Prothrombin Time: 12.8 seconds (ref 11.6–15.2)

## 2013-11-15 LAB — I-STAT TROPONIN, ED: Troponin i, poc: 0.01 ng/mL (ref 0.00–0.08)

## 2013-11-15 LAB — HEMOGLOBIN A1C
Hgb A1c MFr Bld: 5.5 % (ref <5.7)
MEAN PLASMA GLUCOSE: 111 mg/dL (ref <117)

## 2013-11-15 MED ORDER — HEPARIN SODIUM (PORCINE) 5000 UNIT/ML IJ SOLN
5000.0000 [IU] | Freq: Three times a day (TID) | INTRAMUSCULAR | Status: DC
  Administered 2013-11-15 – 2013-11-17 (×7): 5000 [IU] via SUBCUTANEOUS
  Filled 2013-11-15 (×10): qty 1

## 2013-11-15 MED ORDER — LISINOPRIL 20 MG PO TABS
20.0000 mg | ORAL_TABLET | Freq: Every day | ORAL | Status: DC
  Administered 2013-11-15 – 2013-11-17 (×3): 20 mg via ORAL
  Filled 2013-11-15 (×3): qty 1

## 2013-11-15 MED ORDER — SODIUM CHLORIDE 0.9 % IV SOLN
INTRAVENOUS | Status: AC
  Administered 2013-11-15: 1000 mL via INTRAVENOUS

## 2013-11-15 MED ORDER — PANTOPRAZOLE SODIUM 40 MG PO TBEC
40.0000 mg | DELAYED_RELEASE_TABLET | Freq: Every day | ORAL | Status: DC
  Administered 2013-11-15 – 2013-11-17 (×3): 40 mg via ORAL
  Filled 2013-11-15 (×2): qty 1

## 2013-11-15 MED ORDER — CLOPIDOGREL BISULFATE 75 MG PO TABS
75.0000 mg | ORAL_TABLET | Freq: Every day | ORAL | Status: DC
  Administered 2013-11-15 – 2013-11-17 (×3): 75 mg via ORAL
  Filled 2013-11-15 (×5): qty 1

## 2013-11-15 MED ORDER — OXYCODONE-ACETAMINOPHEN 5-325 MG PO TABS
1.0000 | ORAL_TABLET | Freq: Once | ORAL | Status: AC
  Administered 2013-11-15: 1 via ORAL
  Filled 2013-11-15: qty 1

## 2013-11-15 MED ORDER — DONEPEZIL HCL 5 MG PO TABS
5.0000 mg | ORAL_TABLET | Freq: Every day | ORAL | Status: DC
  Administered 2013-11-15 – 2013-11-16 (×2): 5 mg via ORAL
  Filled 2013-11-15 (×3): qty 1

## 2013-11-15 MED ORDER — ATORVASTATIN CALCIUM 10 MG PO TABS
10.0000 mg | ORAL_TABLET | Freq: Every day | ORAL | Status: DC
  Administered 2013-11-15 – 2013-11-17 (×3): 10 mg via ORAL
  Filled 2013-11-15 (×3): qty 1

## 2013-11-15 MED ORDER — ONDANSETRON HCL 4 MG/2ML IJ SOLN: 4.0000 mg | Freq: Three times a day (TID) | INTRAMUSCULAR | Status: AC | PRN

## 2013-11-15 MED ORDER — SODIUM CHLORIDE 0.9 % IV BOLUS (SEPSIS)
1000.0000 mL | Freq: Once | INTRAVENOUS | Status: AC
  Administered 2013-11-15: 1000 mL via INTRAVENOUS

## 2013-11-15 MED ORDER — FAMOTIDINE 40 MG PO TABS
40.0000 mg | ORAL_TABLET | Freq: Three times a day (TID) | ORAL | Status: DC
  Administered 2013-11-15 – 2013-11-17 (×7): 40 mg via ORAL
  Filled 2013-11-15 (×9): qty 1

## 2013-11-15 MED ORDER — RISPERIDONE 0.5 MG PO TABS
0.5000 mg | ORAL_TABLET | Freq: Every day | ORAL | Status: DC
  Administered 2013-11-15 – 2013-11-16 (×2): 0.5 mg via ORAL
  Filled 2013-11-15 (×3): qty 1

## 2013-11-15 NOTE — Procedures (Signed)
ELECTROENCEPHALOGRAM REPORT  Patient: Kim Blevins       Room #: 6B84 EEG No. ID: [id number] Age: 79 y.o.        Sex: female Referring Physician: Viyuoh Report Date:  11/15/2013        Interpreting Physician: Wallie Char  History: [name] is an 79 y.o. female with the previous history of stroke presenting with recurrent spells of confusion for which she has no memory.  Indications for study:  Rule out partial seizure disorder.  Technique: This is an 18 channel routine scalp EEG performed at the bedside with bipolar and monopolar montages arranged in accordance to the international 10/20 system of electrode placement.   Description: This EEG study was performed during wakefulness. The background activity consisted of low amplitude diffuse 1-2 Hz irregular delta activity with superimposed 9 Hz alpha rhythm recorded from the posterior head regions. Photic stimulation was not performed. Hyperventilation was not performed. No epileptiform discharges were recorded. There were no areas of disproportionate focal slowing of cerebral activity.  Interpretation: This EEG showed minimal generalized nonspecific continuous slowing of cerebral activity. This pattern of slowing can be seen with metabolic as well as degenerative and toxic etiologies. No evidence of an epileptic disorder was demonstrated.   Rush Farmer M.D. [facility] [phone]

## 2013-11-15 NOTE — ED Notes (Signed)
PER EMS: pt from home, around 5:30 or 6pm this evening patients husband reports patient suddenly became weak, "seemed out of it," slurred speech. Pt took a short nap, woke up and ambulated with unsteady gait and increased slurred and sluggish to speak. Denies N/V/D and denies abnormal urinary symptoms but earlier today patient had one episode of urinary incontinence and this has never happened before-unsure what time this was. Pt still lethargic but alert to self, place, and time. CBG-123, BP-157/76, HR-57, 94% RA. Pt speech still slow to respond.

## 2013-11-15 NOTE — Consult Note (Signed)
Referring Physician: Alcario Drought    Chief Complaint: Weakness, dizziness  HPI: Kim Blevins is an 79 y.o. female who was at her baseline yesterday until about 1730.  Patient was noted by husband to have generalized weakness at that time.  She took a nap and her symptoms seem to have resolved when she awakened.  She went to bed around 2100.  She awakened about midnight to go to the bathroom and did not make it due to the fact that she could not walk.  Speech was slurred.  EMS was called and the patient was brought in for evaluation.  While in the ED she was asking for her baby.  She now reports that she was talking about her grandson although she seemed per staff to be hallucinating at the time.  Patient is amnestic of the event otherwise.  She is now at baseline per her husband.  Patient has had multiple similar episodes since September 15.   Her husband reports a frequency of about one a month.  Was diagnosed with embolic infarcts in December when seen.  Work up included a TTE, TEE and carotid doppler that were unremarkable.    Date last known well: Date: 11/14/2013 Time last known well: Time: 21:00 tPA Given: No: Resolution of symptoms  Past Medical History  Diagnosis Date  . Hypertension   . GERD (gastroesophageal reflux disease)   . Stroke   . TIA (transient ischemic attack)   . Lupus     Past Surgical History  Procedure Laterality Date  . Appendectomy    . Esophagogastroduodenoscopy    . Tee without cardioversion N/A 07/10/2013    Procedure: TRANSESOPHAGEAL ECHOCARDIOGRAM (TEE);  Surgeon: Thayer Headings, MD;  Location: [facility] ENDOSCOPY;  Service: Cardiovascular;  Laterality: N/A;    Family history: No family history of stroke  Social History:  reports that she has never smoked. She does not have any smokeless tobacco history on file. She reports that she does not drink alcohol or use illicit drugs.  Allergies:  Allergies  Allergen Reactions  . Plaquenil [Hydroxychloroquine  Sulfate] Rash    Medications:  I have reviewed the patient's current medications. Prior to Admission:  Prescriptions prior to admission  Medication Sig Dispense Refill  . atorvastatin (LIPITOR) 10 MG tablet Take 10 mg by mouth daily.      . butalbital-acetaminophen-caffeine (FIORICET, ESGIC) 50-325-40 MG per tablet Take 2 tablets by mouth every 6 (six) hours as needed for headache.       . Cholecalciferol (VITAMIN D-3 PO) Take 1 capsule by mouth daily.      . clopidogrel (PLAVIX) 75 MG tablet Take 75 mg by mouth daily with breakfast.      . donepezil (ARICEPT) 5 MG tablet Take 5 mg by mouth at bedtime.      . famotidine (PEPCID) 40 MG tablet Take 40 mg by mouth 3 (three) times daily.      Marland Kitchen lisinopril (PRINIVIL,ZESTRIL) 20 MG tablet Take 20 mg by mouth daily.      . Multiple Vitamins-Minerals (MULTIVITAMIN PO) Take 1 tablet by mouth daily.      . Omega-3 Fatty Acids (FISH OIL PO) Take 1 capsule by mouth daily.      Marland Kitchen omeprazole (PRILOSEC) 20 MG capsule Take 20 mg by mouth 2 (two) times daily.       Marland Kitchen oxyCODONE-acetaminophen (PERCOCET) 7.5-325 MG per tablet Take 1 tablet by mouth every 6 (six) hours as needed for pain.       Marland Kitchen  risperiDONE (RISPERDAL) 0.5 MG tablet Take 0.5 mg by mouth at bedtime.      Marland Kitchen zolpidem (AMBIEN) 10 MG tablet Take 0.5 tablets (5 mg total) by mouth at bedtime as needed for sleep.  5 tablet  0   Scheduled: . sodium chloride   Intravenous STAT  . atorvastatin  10 mg Oral Daily  . clopidogrel  75 mg Oral Q breakfast  . donepezil  5 mg Oral QHS  . famotidine  40 mg Oral TID  . heparin  5,000 Units Subcutaneous 3 times per day  . lisinopril  20 mg Oral Daily  . pantoprazole  40 mg Oral Daily  . risperiDONE  0.5 mg Oral QHS    ROS: History obtained from the patient  General ROS: negative for - chills, fatigue, fever, night sweats, weight gain or weight loss Psychological ROS: memory difficulties Ophthalmic ROS: negative for - blurry vision, double vision, eye  pain or loss of vision ENT ROS: negative for - epistaxis, nasal discharge, oral lesions, sore throat, tinnitus or vertigo Allergy and Immunology ROS: negative for - hives or itchy/watery eyes Hematological and Lymphatic ROS: negative for - bleeding problems, bruising or swollen lymph nodes Endocrine ROS: negative for - galactorrhea, hair pattern changes, polydipsia/polyuria or temperature intolerance Respiratory ROS: negative for - cough, hemoptysis, shortness of breath or wheezing Cardiovascular ROS: negative for - chest pain, dyspnea on exertion, edema or irregular heartbeat Gastrointestinal ROS: negative for - abdominal pain, diarrhea, hematemesis, nausea/vomiting or stool incontinence Genito-Urinary ROS: negative for - dysuria, hematuria, incontinence or urinary frequency/urgency Musculoskeletal ROS: negative for - joint swelling or muscular weakness Neurological ROS: as noted in HPI Dermatological ROS: negative for rash and skin lesion changes  Physical Examination: Blood pressure 171/75, pulse 63, temperature 97.8 F (36.6 C), temperature source Oral, resp. rate 16, height 5\' 3"  (1.6 m), weight 57.1 kg (125 lb 14.1 oz), SpO2 97.00%.  Neurologic Examination: Mental Status: Alert, oriented, thought content appropriate.  Speech fluent without evidence of aphasia.  Able to follow 3 step commands without difficulty. Cranial Nerves: II: Discs flat bilaterally; Visual fields grossly normal, pupils equal, round, reactive to light and accommodation III,IV, VI: ptosis not present, extra-ocular motions intact bilaterally V,VII: decrease in right NLF, facial light touch sensation normal bilaterally VIII: hearing normal bilaterally IX,X: gag reflex present XI: bilateral shoulder shrug XII: midline tongue extension Motor: Right : Upper extremity   5-/5    Left:     Upper extremity   5/5  Lower extremity   5/5     Lower extremity   5/5 Tone and bulk:normal tone throughout; no atrophy  noted Sensory: Pinprick and light touch intact throughout, bilaterally Deep Tendon Reflexes: 2+ and symmetric in the upper extremities, 1+ at the knees and absent at the ankles.   Plantars: Right: downgoing   Left: downgoing Cerebellar: normal finger-to-nose and normal heel-to-shin test Gait: Unable to test CV: pulses palpable throughout     Laboratory Studies:  Basic Metabolic Panel:  Recent Labs Lab 11/15/13 0140  NA 141  K 3.6*  CL 105  CO2 23  GLUCOSE 105*  BUN 14  CREATININE 1.14*  CALCIUM 9.0    Liver Function Tests:  Recent Labs Lab 11/15/13 0140  AST 15  ALT 11  ALKPHOS 67  BILITOT 0.3  PROT 6.6  ALBUMIN 3.7   No results found for this basename: LIPASE, AMYLASE,  in the last 168 hours No results found for this basename: AMMONIA,  in the last 168  hours  CBC:  Recent Labs Lab 11/15/13 0140  WBC 7.4  HGB 10.8*  HCT 33.5*  MCV 89.8  PLT 207    Cardiac Enzymes: No results found for this basename: CKTOTAL, CKMB, CKMBINDEX, TROPONINI,  in the last 168 hours  BNP: No components found with this basename: POCBNP,   CBG:  Recent Labs Lab 11/15/13 0212  GLUCAP 102*    Microbiology: No results found for this or any previous visit.  Coagulation Studies:  Recent Labs  11/15/13 0140  LABPROT 12.8  INR 0.98    Urinalysis:  Recent Labs Lab 11/15/13 0152  COLORURINE YELLOW  LABSPEC 1.012  PHURINE 7.0  GLUCOSEU NEGATIVE  HGBUR NEGATIVE  BILIRUBINUR NEGATIVE  KETONESUR NEGATIVE  PROTEINUR NEGATIVE  UROBILINOGEN 0.2  NITRITE NEGATIVE  LEUKOCYTESUR NEGATIVE    Lipid Panel:    Component Value Date/Time   CHOL 146 11/15/2013 0502   TRIG 55 11/15/2013 0502   HDL 66 11/15/2013 0502   CHOLHDL 2.2 11/15/2013 0502   VLDL 11 11/15/2013 0502   LDLCALC 69 11/15/2013 0502    HgbA1C:  Lab Results  Component Value Date   HGBA1C 5.5 07/08/2013    Urine Drug Screen:   No results found for this basename: labopia, cocainscrnur, labbenz,  amphetmu, thcu, labbarb    Alcohol Level: No results found for this basename: ETH,  in the last 168 hours  Other results: EKG: normal sinus rhythm at 55bpm.  Imaging: Ct Head Wo Contrast  11/15/2013   CLINICAL DATA:  Mental status change, slurred speech, and unsteady gait.  EXAM: CT HEAD WITHOUT CONTRAST  TECHNIQUE: Contiguous axial images were obtained from the base of the skull through the vertex without intravenous contrast.  COMPARISON:  CT HEAD W/O CM dated 08/20/2013  FINDINGS: There is mild diffuse cerebral and cerebellar atrophy with compensatory ventriculomegaly. There is decreased density in the deep white matter of both cerebral hemispheres consistent with chronic small vessel ischemic type change. There is an old lacunar infarction in the posterior aspect of the left basal ganglia. There is no evidence of an acute intracranial hemorrhage nor of an evolving ischemic event. The cerebellum and brainstem are normal in density.  At bone window settings the observed portions of the paranasal sinuses and mastoid air cells are clear. There is no evidence of an acute skull fracture.  IMPRESSION: 1. There is no evidence of an acute intracranial hemorrhage nor of an acute ischemic infarction. 2. There are stable chronic changes of small vessel ischemia. 3. There is no evidence of an acute skull fracture.   Electronically Signed   By: David  Martinique   On: 11/15/2013 01:46   Dg Chest Port 1 View  11/15/2013   CLINICAL DATA:  Shortness of breath and weakness.  EXAM: PORTABLE CHEST - 1 VIEW  COMPARISON:  Chest radiograph performed 07/07/2013  FINDINGS: The lungs are well-aerated. Minimal bibasilar atelectasis is noted. There is no evidence of pleural effusion or pneumothorax.  The cardiomediastinal silhouette is borderline normal in size. No acute osseous abnormalities are seen.  IMPRESSION: Minimal bibasilar atelectasis noted; lungs otherwise clear.   Electronically Signed   By: Garald Balding M.D.   On:  11/15/2013 02:59    Assessment: 79 y.o. female presenting after an episode of weakness, dizziness and altered mental status of which she is unaware.  Patient has had multiple such events in the past.  Diagnosed with embolic infarcts in December and placed on Plavix.  Patient remains on Plavix.  Is  at baseline at this time.  Head CT reviewed and shows no acute changes.     Stroke Risk Factors - hypertension  Plan: 1. HgbA1c, fasting lipid panel 2. MRI, MRA  of the brain without contrast 3. PT consult, OT consult, Speech consult 4. Echocardiogram 5. Prophylactic therapy-Antiplatelet med: Aspirin - dose 325mg   6. Telemetry monitoring 7. Frequent neuro checks 8. EEG   Alexis Goodell, MD [facility] [phone] 11/15/2013, 6:33 AM

## 2013-11-15 NOTE — H&P (Addendum)
[facility] History and Physical  SYNCERE KAMINSKI JSE:[id number] DOB: [birth year] DOA: 11/15/2013  Referring physician: EDP PCP: Patricia Nettle, MD   Chief Complaint: AMS   HPI: Kim Blevins is a 79 y.o. female who presents to the ED after an episode of acute generalized weakness and confusion.  Patient has been very weak and tired since 5:30 pm yesterday.  She went to bed, woke up around 1 am and tried to go to the wash room and could not make it due to generalized weakness and ? Dizziness.  Patient has very poor memory of that event and the time she arrived to the hospital.  On arrival to the hospital patient was confused and asking where her baby is (baby is grown up now and she actually has a grand child).  Confusion slowly but spontaneously resolved over course of ED visit and patient now back to baseline.  Review of Systems: Systems reviewed.  As above, otherwise negative  Past Medical History  Diagnosis Date  . Hypertension   . GERD (gastroesophageal reflux disease)   . Stroke   . TIA (transient ischemic attack)   . Lupus    Past Surgical History  Procedure Laterality Date  . Appendectomy    . Esophagogastroduodenoscopy    . Tee without cardioversion N/A 07/10/2013    Procedure: TRANSESOPHAGEAL ECHOCARDIOGRAM (TEE);  Surgeon: Thayer Headings, MD;  Location: [place];  Service: Cardiovascular;  Laterality: N/A;   Social History:  reports that she has never smoked. She does not have any smokeless tobacco history on file. She reports that she does not drink alcohol or use illicit drugs.  Allergies  Allergen Reactions  . Plaquenil [Hydroxychloroquine Sulfate] Rash    No family history on file.   Prior to Admission medications   Medication Sig Start Date End Date Taking? Authorizing Provider  atorvastatin (LIPITOR) 10 MG tablet Take 10 mg by mouth daily.   Yes Historical Provider, MD  butalbital-acetaminophen-caffeine (FIORICET, ESGIC) 50-325-40 MG per tablet Take 2  tablets by mouth every 6 (six) hours as needed for headache.    Yes Historical Provider, MD  Cholecalciferol (VITAMIN D-3 PO) Take 1 capsule by mouth daily.   Yes Historical Provider, MD  clopidogrel (PLAVIX) 75 MG tablet Take 75 mg by mouth daily with breakfast.   Yes Historical Provider, MD  donepezil (ARICEPT) 5 MG tablet Take 5 mg by mouth at bedtime.   Yes Historical Provider, MD  famotidine (PEPCID) 40 MG tablet Take 40 mg by mouth 3 (three) times daily.   Yes Historical Provider, MD  lisinopril (PRINIVIL,ZESTRIL) 20 MG tablet Take 20 mg by mouth daily.   Yes Historical Provider, MD  Multiple Vitamins-Minerals (MULTIVITAMIN PO) Take 1 tablet by mouth daily.   Yes Historical Provider, MD  Omega-3 Fatty Acids (FISH OIL PO) Take 1 capsule by mouth daily.   Yes Historical Provider, MD  omeprazole (PRILOSEC) 20 MG capsule Take 20 mg by mouth 2 (two) times daily.    Yes Historical Provider, MD  oxyCODONE-acetaminophen (PERCOCET) 7.5-325 MG per tablet Take 1 tablet by mouth every 6 (six) hours as needed for pain.    Yes Historical Provider, MD  risperiDONE (RISPERDAL) 0.5 MG tablet Take 0.5 mg by mouth at bedtime.   Yes Historical Provider, MD  zolpidem (AMBIEN) 10 MG tablet Take 0.5 tablets (5 mg total) by mouth at bedtime as needed for sleep. 07/09/13  Yes Louellen Molder, MD   Physical Exam: Filed Vitals:   11/15/13 0500  BP: 169/65  Pulse: 60  Temp:   Resp: 18    BP 169/65  Pulse 60  Temp(Src) 97.5 F (36.4 C) (Oral)  Resp 18  SpO2 100%  General Appearance:    Alert, oriented, no distress, appears stated age  Head:    Normocephalic, atraumatic  Eyes:    PERRL, EOMI, sclera non-icteric        Nose:   Nares without drainage or epistaxis. Mucosa, turbinates normal  Throat:   Moist mucous membranes. Oropharynx without erythema or exudate.  Neck:   Supple. No carotid bruits.  No thyromegaly.  No lymphadenopathy.   Back:     No CVA tenderness, no spinal tenderness  Lungs:     Clear  to auscultation bilaterally, without wheezes, rhonchi or rales  Chest wall:    No tenderness to palpitation  Heart:    Regular rate and rhythm without murmurs, gallops, rubs  Abdomen:     Soft, non-tender, nondistended, normal bowel sounds, no organomegaly  Genitalia:    deferred  Rectal:    deferred  Extremities:   No clubbing, cyanosis or edema.  Pulses:   2+ and symmetric all extremities  Skin:   Skin color, texture, turgor normal, no rashes or lesions  Lymph nodes:   Cervical, supraclavicular, and axillary nodes normal  Neurologic:   CNII-XII intact. Normal strength, sensation and reflexes      throughout    Labs on Admission:  Basic Metabolic Panel:  Recent Labs Lab 11/15/13 0140  NA 141  K 3.6*  CL 105  CO2 23  GLUCOSE 105*  BUN 14  CREATININE 1.14*  CALCIUM 9.0   Liver Function Tests:  Recent Labs Lab 11/15/13 0140  AST 15  ALT 11  ALKPHOS 67  BILITOT 0.3  PROT 6.6  ALBUMIN 3.7   No results found for this basename: LIPASE, AMYLASE,  in the last 168 hours No results found for this basename: AMMONIA,  in the last 168 hours CBC:  Recent Labs Lab 11/15/13 0140  WBC 7.4  HGB 10.8*  HCT 33.5*  MCV 89.8  PLT 207   Cardiac Enzymes: No results found for this basename: CKTOTAL, CKMB, CKMBINDEX, TROPONINI,  in the last 168 hours  BNP (last 3 results) No results found for this basename: PROBNP,  in the last 8760 hours CBG:  Recent Labs Lab 11/15/13 0212  GLUCAP 102*    Radiological Exams on Admission: Ct Head Wo Contrast  11/15/2013   CLINICAL DATA:  Mental status change, slurred speech, and unsteady gait.  EXAM: CT HEAD WITHOUT CONTRAST  TECHNIQUE: Contiguous axial images were obtained from the base of the skull through the vertex without intravenous contrast.  COMPARISON:  CT HEAD W/O CM dated 08/20/2013  FINDINGS: There is mild diffuse cerebral and cerebellar atrophy with compensatory ventriculomegaly. There is decreased density in the deep white  matter of both cerebral hemispheres consistent with chronic small vessel ischemic type change. There is an old lacunar infarction in the posterior aspect of the left basal ganglia. There is no evidence of an acute intracranial hemorrhage nor of an evolving ischemic event. The cerebellum and brainstem are normal in density.  At bone window settings the observed portions of the paranasal sinuses and mastoid air cells are clear. There is no evidence of an acute skull fracture.  IMPRESSION: 1. There is no evidence of an acute intracranial hemorrhage nor of an acute ischemic infarction. 2. There are stable chronic changes of small vessel ischemia. 3. There  is no evidence of an acute skull fracture.   Electronically Signed   By: David  Martinique   On: 11/15/2013 01:46   Dg Chest Port 1 View  11/15/2013   CLINICAL DATA:  Shortness of breath and weakness.  EXAM: PORTABLE CHEST - 1 VIEW  COMPARISON:  Chest radiograph performed 07/07/2013  FINDINGS: The lungs are well-aerated. Minimal bibasilar atelectasis is noted. There is no evidence of pleural effusion or pneumothorax.  The cardiomediastinal silhouette is borderline normal in size. No acute osseous abnormalities are seen.  IMPRESSION: Minimal bibasilar atelectasis noted; lungs otherwise clear.   Electronically Signed   By: Garald Balding M.D.   On: 11/15/2013 02:59    EKG: Independently reviewed.  Assessment/Plan Principal Problem:   Generalized weakness Active Problems:   Confusion   1. Generalized weakness and confusion - Will obtain MRI to R/O stroke, symptoms seem very atypical but she does have a history of recent punctate infarcts back in December.  More likely than stroke, however given that after a long discussion with the husband it seems as though this is not the first nor second time that something similar has occurred at night time (has intermittent events for several months); I am more suspicious that she is suffering from sundowning (she does  have known diagnosis of dementia, and is on medications for this).  Will hold off on ordering EEG to r/o an atypical seizure until neurology has evaluated (would be unusual to see post stroke seizures from such a small punctate area of infarct previously).    Code Status: Full Code  Family Communication: Husband at bedside Disposition Plan: Admit to obs   Time spent: 5 min  [facility] Pager [phone]  If 7AM-7PM, please contact the day team taking care of the patient [email or web address] Password [email or web address] 11/15/2013, 5:15 AM

## 2013-11-15 NOTE — Progress Notes (Signed)
EEG Completed; Results Pending  

## 2013-11-15 NOTE — Progress Notes (Signed)
  Echocardiogram 2D Echocardiogram has been performed.  Kim Blevins 11/15/2013, 2:13 PM

## 2013-11-15 NOTE — Progress Notes (Signed)
UR Completed.  Kalynn Declercq Jane Shaylie Eklund [phone] 11/15/2013  

## 2013-11-15 NOTE — ED Notes (Signed)
Pt confused. Pt asking about a baby, reporting she and her husband have a baby. She is asking her husband where her baby is. Pts husband reports they do not have a baby but they have a grandchild but lives in a different city. Husband is wondering why patient is asking about a baby.

## 2013-11-15 NOTE — Progress Notes (Signed)
Physical Therapy Evaluation Patient Details Name: Kim Blevins MRN: [id number] DOB: 1934/11/14 Today's Date: 11/15/2013   History of Present Illness  Patient admitted for confusion, weakness, difficulty walking and slurred speech (per chart). Work-up pending.  Clinical Impression  Patient at baseline level of mobility, supervision/independent with mobility. Baseline balance deviations for higher level balance activity. Patient with good cognitive carry-over and sequencing, no apparent cognitive concerns during session. Do not feel patient needs further acute services at this time. Will sign off, patient in agreement.    Follow Up Recommendations Supervision/Assistance - 24 hour    Equipment Recommendations  None recommended by PT    Recommendations for Other Services       Precautions / Restrictions Precautions Precautions: Fall      Mobility  Bed Mobility Overal bed mobility: Modified Independent             General bed mobility comments: increased time to perform  Transfers Overall transfer level: Modified independent Equipment used: None                Ambulation/Gait Ambulation/Gait assistance: Independent;Modified independent (Device/Increase time) Ambulation Distance (Feet): 280 Feet   Gait Pattern/deviations: Drifts right/left Gait velocity: decreased Gait velocity interpretation: Below normal speed for age/gender    Stairs Stairs: Yes Stairs assistance: Modified independent (Device/Increase time) Stair Management: One rail Left;Alternating pattern Number of Stairs: 7 General stair comments: no difficulty with stair negotiation, uses hand rail for support  Wheelchair Mobility    Modified Rankin (Stroke Patients Only)       Balance                                 Standardized Balance Assessment Standardized Balance Assessment : Dynamic Gait Index   Dynamic Gait Index Level Surface: Normal Change in Gait Speed:  Normal Gait with Horizontal Head Turns: Moderate Impairment Gait with Vertical Head Turns: Moderate Impairment Gait and Pivot Turn: Mild Impairment Step Over Obstacle: Normal Step Around Obstacles: Normal Steps: Normal Total Score: 19       Pertinent Vitals/Pain No pain reported at this time    Home Living Family/patient expects to be discharged to:: Private residence Living Arrangements: Spouse/significant other Available Help at Discharge: Family;Available 24 hours/day Type of Home: House Home Access: Stairs to enter   [employer] of Steps: 7 Home Layout: Two level;Able to live on main level with bedroom/bathroom Home Equipment: Gilford Rile - 2 wheels;Cane - single point Additional Comments: shower seat, walk in shower, standard toilets    Prior Function Level of Independence: Independent               Hand Dominance   Dominant Hand: Right    Extremity/Trunk Assessment   Upper Extremity Assessment: Generalized weakness           Lower Extremity Assessment: Generalized weakness         Communication      Cognition Arousal/Alertness: Awake/alert Behavior During Therapy: WFL for tasks assessed/performed Overall Cognitive Status: Within Functional Limits for tasks assessed                      General Comments      Exercises        Assessment/Plan    PT Assessment Patent does not need any further PT services  PT Diagnosis     PT Problem List    PT Treatment Interventions  PT Goals (Current goals can be found in the Care Plan section) Acute Rehab PT Goals PT Goal Formulation: No goals set, d/c therapy    Frequency     Barriers to discharge        Co-evaluation               End of Session Equipment Utilized During Treatment: Gait belt Activity Tolerance: Patient tolerated treatment well Patient left: in bed;with call bell/phone within reach;with bed alarm set Nurse Communication: Mobility status          Time: 7416-3845 PT Time Calculation (min): 19 min   Charges:   PT Evaluation $Initial PT Evaluation Tier I: 1 Procedure PT Treatments $Gait Training: 8-22 mins   PT G Codes:          Duncan Dull 11/15/2013, 2:48 PM Alben Deeds, [place] DPT  [phone]

## 2013-11-15 NOTE — ED Notes (Signed)
Pt appears to be more oriented and less confused. Pt able to speak in clear, complete sentences. Pt appears still a bit drowsy but able to open and close eyes appropriately and follows commands.

## 2013-11-15 NOTE — ED Provider Notes (Signed)
CSN: [id number]     Arrival date & time 11/15/13  0102 History   First MD Initiated Contact with Patient 11/15/13 0106     Chief Complaint  Patient presents with  . Altered Mental Status     (Consider location/radiation/quality/duration/timing/severity/associated sxs/prior Treatment) HPI Comments: 79 year old female with a stroke, hypertension, fall history presents with general weakness. Since 5:30 PM the husband reports that she has been extremely weak and tired. She took a nap following upon awakening symptoms gradually worsened. Patient tried to go to the washroom and could not make it due to general weakness and had to hold on. Patient does report dizziness however no fall or head injuries. Patient denies recent infection or fevers. Patient has had mild confusion and making statements about her baby nearby per husband. No focal findings. Nothing has improved his symptoms.  Patient is a 79 y.o. female presenting with altered mental status. The history is provided by the patient and the spouse.  Altered Mental Status Associated symptoms: weakness   Associated symptoms: no abdominal pain, no fever, no headaches, no light-headedness, no rash and no vomiting     Past Medical History  Diagnosis Date  . Hypertension   . GERD (gastroesophageal reflux disease)   . Stroke   . TIA (transient ischemic attack)   . Lupus    Past Surgical History  Procedure Laterality Date  . Appendectomy    . Esophagogastroduodenoscopy    . Tee without cardioversion N/A 07/10/2013    Procedure: TRANSESOPHAGEAL ECHOCARDIOGRAM (TEE);  Surgeon: Thayer Headings, MD;  Location: [facility] ENDOSCOPY;  Service: Cardiovascular;  Laterality: N/A;   No family history on file. History  Substance Use Topics  . Smoking status: Never Smoker   . Smokeless tobacco: Not on file  . Alcohol Use: No   OB History   Grav Para Term Preterm Abortions TAB SAB Ect Mult Living                 Review of Systems  Constitutional:  Positive for fatigue. Negative for fever and chills.  HENT: Negative for congestion.   Eyes: Negative for visual disturbance.  Respiratory: Negative for shortness of breath.   Cardiovascular: Negative for chest pain.  Gastrointestinal: Negative for vomiting and abdominal pain.  Genitourinary: Negative for dysuria and flank pain.  Musculoskeletal: Positive for gait problem. Negative for back pain, neck pain and neck stiffness.  Skin: Negative for rash.  Neurological: Positive for dizziness and weakness. Negative for syncope, light-headedness and headaches.      Allergies  Plaquenil  Home Medications   Prior to Admission medications   Medication Sig Start Date End Date Taking? Authorizing Provider  atorvastatin (LIPITOR) 10 MG tablet Take 10 mg by mouth daily.   Yes Historical Provider, MD  butalbital-acetaminophen-caffeine (FIORICET, ESGIC) 50-325-40 MG per tablet Take 2 tablets by mouth every 6 (six) hours as needed for headache.    Yes Historical Provider, MD  Cholecalciferol (VITAMIN D-3 PO) Take 1 capsule by mouth daily.   Yes Historical Provider, MD  clopidogrel (PLAVIX) 75 MG tablet Take 75 mg by mouth daily with breakfast.   Yes Historical Provider, MD  donepezil (ARICEPT) 5 MG tablet Take 5 mg by mouth at bedtime.   Yes Historical Provider, MD  famotidine (PEPCID) 40 MG tablet Take 40 mg by mouth 3 (three) times daily.   Yes Historical Provider, MD  lisinopril (PRINIVIL,ZESTRIL) 20 MG tablet Take 20 mg by mouth daily.   Yes Historical Provider, MD  Multiple Vitamins-Minerals (  MULTIVITAMIN PO) Take 1 tablet by mouth daily.   Yes Historical Provider, MD  Omega-3 Fatty Acids (FISH OIL PO) Take 1 capsule by mouth daily.   Yes Historical Provider, MD  omeprazole (PRILOSEC) 20 MG capsule Take 20 mg by mouth 2 (two) times daily.    Yes Historical Provider, MD  oxyCODONE-acetaminophen (PERCOCET) 7.5-325 MG per tablet Take 1 tablet by mouth every 6 (six) hours as needed for pain.    Yes  Historical Provider, MD  risperiDONE (RISPERDAL) 0.5 MG tablet Take 0.5 mg by mouth at bedtime.   Yes Historical Provider, MD  zolpidem (AMBIEN) 10 MG tablet Take 0.5 tablets (5 mg total) by mouth at bedtime as needed for sleep. 07/09/13  Yes Nishant Dhungel, MD   BP 158/59  Pulse 52  Temp(Src) 97.5 F (36.4 C) (Oral)  Resp 16  SpO2 19% Physical Exam  Nursing note and vitals reviewed. Constitutional: She is oriented to person, place, and time. She appears well-developed and well-nourished. No distress.  HENT:  Head: Normocephalic and atraumatic.  Dry mucous membranes  Eyes: Conjunctivae are normal. Right eye exhibits no discharge. Left eye exhibits no discharge.  Neck: Normal range of motion. Neck supple. No tracheal deviation present.  Cardiovascular: Regular rhythm.  Bradycardia present.   Pulmonary/Chest: Effort normal and breath sounds normal.  Abdominal: Soft. She exhibits no distension. There is no tenderness. There is no guarding.  Musculoskeletal: She exhibits no edema.  Neurological: She is alert and oriented to person, place, and time. Coordination normal. GCS eye subscore is 4. GCS verbal subscore is 5. GCS motor subscore is 6.  Mild general weakness and extra time required for tasks.4+ strength in UE and LE with f/e at major joints. Patient knows date, location, husband, watch, year. Sensation to palpation intact in UE and LE. CNs 2-12 grossly intact.  EOMFI.  PERRL.   Finger nose and coordination intact bilateral.   Visual fields intact to finger testing.   Skin: Skin is warm. No rash noted.    ED Course  Procedures (including critical care time) Labs Review Labs Reviewed  CBC - Abnormal; Notable for the following:    RBC 3.73 (*)    Hemoglobin 10.8 (*)    HCT 33.5 (*)    All other components within normal limits  COMPREHENSIVE METABOLIC PANEL - Abnormal; Notable for the following:    Potassium 3.6 (*)    Glucose, Bld 105 (*)    Creatinine, Ser 1.14 (*)     GFR calc non Af Amer 46 (*)    GFR calc Af Amer 53 (*)    All other components within normal limits  CBG MONITORING, ED - Abnormal; Notable for the following:    Glucose-Capillary 102 (*)    All other components within normal limits  URINALYSIS, ROUTINE W REFLEX MICROSCOPIC  PROTIME-INR  I-STAT TROPOININ, ED    Imaging Review Ct Head Wo Contrast  11/15/2013   CLINICAL DATA:  Mental status change, slurred speech, and unsteady gait.  EXAM: CT HEAD WITHOUT CONTRAST  TECHNIQUE: Contiguous axial images were obtained from the base of the skull through the vertex without intravenous contrast.  COMPARISON:  CT HEAD W/O CM dated 08/20/2013  FINDINGS: There is mild diffuse cerebral and cerebellar atrophy with compensatory ventriculomegaly. There is decreased density in the deep white matter of both cerebral hemispheres consistent with chronic small vessel ischemic type change. There is an old lacunar infarction in the posterior aspect of the left basal ganglia. There is no  evidence of an acute intracranial hemorrhage nor of an evolving ischemic event. The cerebellum and brainstem are normal in density.  At bone window settings the observed portions of the paranasal sinuses and mastoid air cells are clear. There is no evidence of an acute skull fracture.  IMPRESSION: 1. There is no evidence of an acute intracranial hemorrhage nor of an acute ischemic infarction. 2. There are stable chronic changes of small vessel ischemia. 3. There is no evidence of an acute skull fracture.   Electronically Signed   By: David  Martinique   On: 11/15/2013 01:46     EKG Interpretation   Date/Time:  Wednesday November 15 2013 01:07:58 EDT Ventricular Rate:  55 PR Interval:  151 QRS Duration: 80 QT Interval:  403 QTC Calculation: 385 R Axis:   -15 Text Interpretation:  Sinus rhythm Borderline left axis deviation Low  voltage, precordial leads Confirmed by Dashawn Bartnick  MD, Ladavia Lindenbaum ([id number]) on  11/15/2013 2:32:36 AM      MDM   Final  diagnoses:  Confusion  Dizziness  General weakness   General weakness with broad differential. Plan for workup for metabolic, infectious, stroke, cardiac, other. CT head no acute findings. Fluid bolus given in ED. Plan for admission for further evaluation and monitoring likely MRI in the morning. Patient improved on recheck after fluid bolus. Patient still has general weakness. Discussed with neurology for consult and [facility] for observation for likely MRI in the morning. Family comfortable with this plan. The patients results and plan were reviewed and discussed.   Any x-rays performed were personally reviewed by myself.   Differential diagnosis were considered with the presenting HPI.   EKG: reviewed  Filed Vitals:   11/15/13 0109 11/15/13 0213  BP: 176/67 158/59  Pulse: 55 52  Temp: 97.5 F (36.4 C)   TempSrc: Oral   Resp: 18 16  SpO2: 100% 19%    Admission/ observation were discussed with the admitting physician, patient and/or family and they are comfortable with the plan.     Mariea Clonts, MD 11/16/13 1020

## 2013-11-15 NOTE — Progress Notes (Signed)
[facility] PROGRESS NOTE  MADDIX KLIEWER KPQ:[id number] DOB: 1935/01/19 DOA: 11/15/2013 PCP: Patricia Nettle, MD I have seen and examined pt who is a 79 year old admitted this am by Dr Alcario Drought withhistory of hypertension, GERD, TIA, CVA, lupus presenting with generalized weakness and confusion. CT scan in ED negative for acute infarct. Patient denies any complaints at this time she is alert and orientedx2. Will await MRI and pending CVA workup,consult PT OT and follow     [facility] Pager [phone]. If 7PM-7AM, please contact night-coverage at [email or web address], password [facility] 11/15/2013, 8:54 AM  LOS: 0 days

## 2013-11-15 NOTE — ED Notes (Signed)
Pt laying in bed, speaking full clear sentences. Husband reports pt felt fine earlier today when around 5:30pm this past evening the patient began to feel weak and increased tiredness, then took a nap and an hour and a half later husband reports she woke up and patient seemed to be more alert and stayed up watching television until about 9pm. Pt went to bed at 9pm and around 12am this morning the patient called out for her husband and her speech was more slurred and weakness was back.

## 2013-11-15 NOTE — ED Notes (Signed)
Dr. Gardener at bedside. 

## 2013-11-16 MED ORDER — ACETAMINOPHEN 325 MG PO TABS
650.0000 mg | ORAL_TABLET | ORAL | Status: DC | PRN
  Administered 2013-11-16 – 2013-11-17 (×2): 650 mg via ORAL
  Filled 2013-11-16: qty 2

## 2013-11-16 MED ORDER — LEVETIRACETAM 500 MG PO TABS
500.0000 mg | ORAL_TABLET | Freq: Two times a day (BID) | ORAL | Status: DC
  Administered 2013-11-16 – 2013-11-17 (×2): 500 mg via ORAL
  Filled 2013-11-16 (×3): qty 1

## 2013-11-16 NOTE — Progress Notes (Addendum)
[facility] PROGRESS NOTE  Kim Blevins OFB:[id number] DOB: Jun 15, 1935 DOA: 11/15/2013 PCP: Patricia Nettle, MD  Assessment/Plan: Gen Weakness and confusion- recurrent -MRI/MRA neg, EEG neg for epileptiform activity -discussed with Neuro and Dr Nicole Kindred recommends to start on Keppra trial  For possible partial seizures and pt to follow up with neuro oupt -I have started on keppra, will plan to dc in am remains stable HTN -continue outpt meds H/O CVA -continue statin and plavix GERD -continue pepcid Code Status: full Family Communication: husband at bedside  Disposition Plan: to home when medically ready   Consultants:  neuro  Procedures:  Echo  ------------------------------------------------------------ Study Conclusions  - Left ventricle: The cavity size was normal. Systolic function was normal. The estimated ejection fraction was in the range of 55% to 60%. Although no diagnostic regional wall motion abnormality was identified, this possibility cannot be completely excluded on the basis of this study. - Left atrium: The atrium was moderately dilated   EEG This EEG showed minimal generalized nonspecific continuous slowing of cerebral activity. This pattern of slowing can be seen with metabolic as well as degenerative and toxic etiologies. No evidence of an epileptic disorder was demonstrated.     Antibiotics:  none  HPI/Subjective: Pt denies any c/o today, husband at bedside  Objective: Filed Vitals:   11/16/13 1815  BP: 163/75  Pulse: 66  Temp: 98.1 F (36.7 C)  Resp: 18   No intake or output data in the 24 hours ending 11/16/13 1848 Filed Weights   11/15/13 0547  Weight: 57.1 kg (125 lb 14.1 oz)    Exam:  General: alert & answers appropriately In NAD Cardiovascular: RRR, nl S1 s2 Respiratory: CTAB Abdomen: soft +BS NT/ND, no masses palpable Extremities: No cyanosis and no edema     Data Reviewed: Basic Metabolic Panel:  Recent  Labs Lab 11/15/13 0140  NA 141  K 3.6*  CL 105  CO2 23  GLUCOSE 105*  BUN 14  CREATININE 1.14*  CALCIUM 9.0   Liver Function Tests:  Recent Labs Lab 11/15/13 0140  AST 15  ALT 11  ALKPHOS 67  BILITOT 0.3  PROT 6.6  ALBUMIN 3.7   No results found for this basename: LIPASE, AMYLASE,  in the last 168 hours No results found for this basename: AMMONIA,  in the last 168 hours CBC:  Recent Labs Lab 11/15/13 0140  WBC 7.4  HGB 10.8*  HCT 33.5*  MCV 89.8  PLT 207   Cardiac Enzymes: No results found for this basename: CKTOTAL, CKMB, CKMBINDEX, TROPONINI,  in the last 168 hours BNP (last 3 results) No results found for this basename: PROBNP,  in the last 8760 hours CBG:  Recent Labs Lab 11/15/13 0212  GLUCAP 102*    No results found for this or any previous visit (from the past 240 hour(s)).   Studies: Ct Head Wo Contrast  11/15/2013   CLINICAL DATA:  Mental status change, slurred speech, and unsteady gait.  EXAM: CT HEAD WITHOUT CONTRAST  TECHNIQUE: Contiguous axial images were obtained from the base of the skull through the vertex without intravenous contrast.  COMPARISON:  CT HEAD W/O CM dated 08/20/2013  FINDINGS: There is mild diffuse cerebral and cerebellar atrophy with compensatory ventriculomegaly. There is decreased density in the deep white matter of both cerebral hemispheres consistent with chronic small vessel ischemic type change. There is an old lacunar infarction in the posterior aspect of the left basal ganglia. There is no evidence of an acute intracranial  hemorrhage nor of an evolving ischemic event. The cerebellum and brainstem are normal in density.  At bone window settings the observed portions of the paranasal sinuses and mastoid air cells are clear. There is no evidence of an acute skull fracture.  IMPRESSION: 1. There is no evidence of an acute intracranial hemorrhage nor of an acute ischemic infarction. 2. There are stable chronic changes of small  vessel ischemia. 3. There is no evidence of an acute skull fracture.   Electronically Signed   By: David  Martinique   On: 11/15/2013 01:46   Mr Brain Wo Contrast  11/15/2013   CLINICAL DATA:  79 year old female with episode of acute weakness and confusion. Initial encounter.  EXAM: MRI HEAD WITHOUT CONTRAST  MRA HEAD WITHOUT CONTRAST  TECHNIQUE: Multiplanar, multiecho pulse sequences of the brain and surrounding structures were obtained without intravenous contrast. Angiographic images of the head were obtained using MRA technique without contrast.  COMPARISON:  Head CT without contrast 0130 hr today. Brain MRI and MRA 07/08/2013.  FINDINGS: MRI HEAD FINDINGS  Stable cerebral volume. Major intracranial vascular flow voids are stable. No restricted diffusion to suggest acute infarction. No midline shift, mass effect, evidence of mass lesion, ventriculomegaly, extra-axial collection or acute intracranial hemorrhage. Cervicomedullary junction and pituitary are within normal limits. Negative visualized cervical spine. Stable and normal bone marrow signal.  Interval expected evolution of the right periatrial white matter infarct that occurred in December. Otherwise stable gray and white matter signal throughout the brain. Patchy and confluent cerebral white matter T2 and FLAIR hyperintensity. Chronic lacunar infarcts in the deep gray matter.  Stable orbits soft tissues. Stable paranasal sinuses and mastoids. Visualized scalp soft tissues are within normal limits.  MRA HEAD FINDINGS  Stable antegrade flow in the posterior circulation with dominant distal right vertebral artery. Normal right PICA. Normal vertebrobasilar junction. Dominant right AICA. No basilar stenosis. Normal SCA and PCA origins. Posterior communicating arteries are diminutive or absent. Bilateral PCA branches are stable.  Stable antegrade flow in both ICA siphons. No ICA stenosis. Ophthalmic artery origins are within normal limits. Stable and normal  carotid termini. Dominant right ACA A1 segment again noted. MCA origins remain normal. Anterior communicating artery and visualized ACA branches are within normal limits, median artery of the corpus callosum present. Visualized bilateral MCA branches are stable and within normal limits.  IMPRESSION: 1. No acute intracranial abnormality. Chronic small vessel ischemia. 2. Stable, negative intracranial MRA.   Electronically Signed   By: Lars Pinks M.D.   On: 11/15/2013 12:07   Dg Chest Port 1 View  11/15/2013   CLINICAL DATA:  Shortness of breath and weakness.  EXAM: PORTABLE CHEST - 1 VIEW  COMPARISON:  Chest radiograph performed 07/07/2013  FINDINGS: The lungs are well-aerated. Minimal bibasilar atelectasis is noted. There is no evidence of pleural effusion or pneumothorax.  The cardiomediastinal silhouette is borderline normal in size. No acute osseous abnormalities are seen.  IMPRESSION: Minimal bibasilar atelectasis noted; lungs otherwise clear.   Electronically Signed   By: Garald Balding M.D.   On: 11/15/2013 02:59   Mr Jodene Nam Head/brain Wo Cm  11/15/2013   CLINICAL DATA:  79 year old female with episode of acute weakness and confusion. Initial encounter.  EXAM: MRI HEAD WITHOUT CONTRAST  MRA HEAD WITHOUT CONTRAST  TECHNIQUE: Multiplanar, multiecho pulse sequences of the brain and surrounding structures were obtained without intravenous contrast. Angiographic images of the head were obtained using MRA technique without contrast.  COMPARISON:  Head CT without contrast  0130 hr today. Brain MRI and MRA 07/08/2013.  FINDINGS: MRI HEAD FINDINGS  Stable cerebral volume. Major intracranial vascular flow voids are stable. No restricted diffusion to suggest acute infarction. No midline shift, mass effect, evidence of mass lesion, ventriculomegaly, extra-axial collection or acute intracranial hemorrhage. Cervicomedullary junction and pituitary are within normal limits. Negative visualized cervical spine. Stable and  normal bone marrow signal.  Interval expected evolution of the right periatrial white matter infarct that occurred in December. Otherwise stable gray and white matter signal throughout the brain. Patchy and confluent cerebral white matter T2 and FLAIR hyperintensity. Chronic lacunar infarcts in the deep gray matter.  Stable orbits soft tissues. Stable paranasal sinuses and mastoids. Visualized scalp soft tissues are within normal limits.  MRA HEAD FINDINGS  Stable antegrade flow in the posterior circulation with dominant distal right vertebral artery. Normal right PICA. Normal vertebrobasilar junction. Dominant right AICA. No basilar stenosis. Normal SCA and PCA origins. Posterior communicating arteries are diminutive or absent. Bilateral PCA branches are stable.  Stable antegrade flow in both ICA siphons. No ICA stenosis. Ophthalmic artery origins are within normal limits. Stable and normal carotid termini. Dominant right ACA A1 segment again noted. MCA origins remain normal. Anterior communicating artery and visualized ACA branches are within normal limits, median artery of the corpus callosum present. Visualized bilateral MCA branches are stable and within normal limits.  IMPRESSION: 1. No acute intracranial abnormality. Chronic small vessel ischemia. 2. Stable, negative intracranial MRA.   Electronically Signed   By: Lars Pinks M.D.   On: 11/15/2013 12:07    Scheduled Meds: . atorvastatin  10 mg Oral Daily  . clopidogrel  75 mg Oral Q breakfast  . donepezil  5 mg Oral QHS  . famotidine  40 mg Oral TID  . heparin  5,000 Units Subcutaneous 3 times per day  . lisinopril  20 mg Oral Daily  . pantoprazole  40 mg Oral Daily  . risperiDONE  0.5 mg Oral QHS   Continuous Infusions:   Principal Problem:   Generalized weakness Active Problems:   Confusion    Time spent: [facility] Pager [phone]. If 7PM-7AM, please contact night-coverage at [email or web address], password  [facility] 11/16/2013, 6:48 PM  LOS: 1 day

## 2013-11-16 NOTE — Evaluation (Signed)
Occupational Therapy Evaluation Patient Details Name: Kim Blevins MRN: [id number] DOB: 1934-08-03 Today's Date: 11/16/2013    History of Present Illness Patient admitted for confusion, weakness, difficulty walking and slurred speech (per chart). MRI negative for acute infarct.   Clinical Impression   Patient evaluated by Occupational Therapy with no further acute OT needs identified. All education has been completed and the patient has no further questions. Pt is at baseline with no obvious deficits.  She is able to perform BADLs independently. See below for any follow-up Occupational Therapy or equipment needs. OT is signing off. Thank you for this referral.     Follow Up Recommendations  No OT follow up;Supervision - Intermittent    Equipment Recommendations  None recommended by OT    Recommendations for Other Services       Precautions / Restrictions Precautions Precautions: Fall Precaution Comments: Pt with history of falls that occur when she has periods of AMS      Mobility Bed Mobility Overal bed mobility: Independent                Transfers Overall transfer level: Independent                    Balance                                            ADL Overall ADL's : Independent                                       General ADL Comments: Pt able to simulate showering in standing with no LOB; able to retrieve items from floor and overhead with no LOB     Vision                     Perception     Praxis Praxis Praxis tested?: Within functional limits    Pertinent Vitals/Pain Denies pain      Hand Dominance Right   Extremity/Trunk Assessment Upper Extremity Assessment Upper Extremity Assessment: Overall WFL for tasks assessed   Lower Extremity Assessment Lower Extremity Assessment: Defer to PT evaluation       Communication Communication Communication: No difficulties   Cognition  Arousal/Alertness: Awake/alert Behavior During Therapy: WFL for tasks assessed/performed Overall Cognitive Status: Within Functional Limits for tasks assessed                     General Comments       Exercises       Shoulder Instructions      Home Living Family/patient expects to be discharged to:: Private residence Living Arrangements: Spouse/significant other Available Help at Discharge: Family;Available 24 hours/day Type of Home: House Home Access: Stairs to enter [employer] of Steps: 7   Home Layout: Two level;Able to live on main level with bedroom/bathroom Alternate Level Stairs-Number of Steps: 13 Alternate Level Stairs-Rails: Right Bathroom Shower/Tub: Occupational psychologist: Standard     Home Equipment: Environmental consultant - 2 wheels;Cane - single point;Shower seat;Grab bars - toilet;Grab bars - tub/shower   Additional Comments: shower seat, walk in shower, standard toilets      Prior Functioning/Environment Level of Independence: Independent        Comments: Pt does not drive  OT Diagnosis:     OT Problem List:     OT Treatment/Interventions:      OT Goals(Current goals can be found in the care plan section)    OT Frequency:     Barriers to D/C:            Co-evaluation              End of Session    Activity Tolerance: Patient tolerated treatment well Patient left: in bed;with call bell/phone within reach;with bed alarm set   Time: 9449-6759 OT Time Calculation (min): 18 min Charges:  OT General Charges $OT Visit: 1 Procedure OT Evaluation $Initial OT Evaluation Tier I: 1 Procedure OT Treatments $Therapeutic Activity: 8-22 mins G-Codes:    Valdez Brannan M Lovell Roe Dec 08, 2013, 2:19 PM

## 2013-11-16 NOTE — Progress Notes (Addendum)
Subjective: Patient had no new complaints. She has not had a recurrence of altered mental status with confusion.  Objective: Current vital signs: BP 163/75  Pulse 66  Temp(Src) 98.1 F (36.7 C) (Oral)  Resp 18  Ht 5\' 3"  (1.6 m)  Wt 57.1 kg (125 lb 14.1 oz)  BMI 22.30 kg/m2  SpO2 96%  Neurologic Exam: Alert and in no acute distress. Patient is well-oriented to time as well as place. She had no receptive or expressive aphasia. Speech was normal. Patient moved extremities equally with no signs of focal weakness. Coordination was normal.  MRI of the brain showed no acute intracranial abnormality. MRA showed no significant intracranial or vascular abnormality. EEG showed mild generalized slowing which is nonspecific. No evidence of an epileptic disorder is demonstrated.  Medications: I have reviewed the patient's current medications.  Assessment/Plan: Etiology for patient's recurrent spells of confusion for, which she has no memory, remains unclear. I suspect however she's been experiencing complex partial seizures. As such, recommending a trial of Keppra 500 mg twice a day for 3-6 months to see if spells and decreased in frequency or discontinue attogether.  Recommend followup with her local neurologist following discharge. No further neurodiagnostic studies are indicated at this point.  C.R. Nicole Kindred, MD [facility] [phone]  11/16/2013  6:54 PM

## 2013-11-17 DIAGNOSIS — G894 Chronic pain syndrome: Secondary | ICD-10-CM

## 2013-11-17 MED ORDER — LEVETIRACETAM 500 MG PO TABS: 500.0000 mg | ORAL_TABLET | Freq: Two times a day (BID) | ORAL | Status: DC

## 2013-11-17 NOTE — Discharge Summary (Signed)
Physician Discharge Summary  Kim Blevins YWV:[id number] DOB: 10-01-1934 DOA: 11/15/2013  PCP: Patricia Nettle, MD  Admit date: 11/15/2013 Discharge date: 11/17/2013  Time spent: >30 minutes  Recommendations for Outpatient Follow-up:  Follow-up Information   Follow up with Patricia Nettle, MD. (in 1-2weeks, call for appt upon discharge)    Specialty:  Family Medicine   Contact information:   [address] [place] [address] [phone]       Follow up with Karl Luke, MD. (in 3-4wks or as scheduled)    Specialty:  Neurology   Contact information:   [address] [place] [address] [phone]        Discharge Diagnoses:  Principal Problem:   Generalized weakness Active Problems:   Confusion   Discharge Condition: improved/stable  Diet recommendation: Low sodium heart healthy  Filed Weights   11/15/13 0547  Weight: 57.1 kg (125 lb 14.1 oz)    History of present illness:  Kim Blevins is a 79 y.o. female who presents to the ED after an episode of acute generalized weakness and confusion. Patient has been very weak and tired since 5:30 pm yesterday. She went to bed, woke up around 1 am and tried to go to the wash room and could not make it due to generalized weakness and ? Dizziness. Patient has very poor memory of that event and the time she arrived to the hospital. On arrival to the hospital patient was confused and asking where her baby is (baby is grown up now and she actually has a grand child). Confusion slowly but spontaneously resolved over course of ED visit and patient now back to baseline   Hospital Course:  Gen Weakness and confusion- recurrent  -MRI/MRA neg, EEG neg for epileptiform activity -discussed with Neuro and Dr Jolene Provost the suspicion was patient had been experiencing complex partial seizures  recommended and on 4/30 she was  started on Keppra  500 twice a day for 3-6 months to see if the episodes would decrease in  frequency or stop altogether -patient remains at her baseline on followup today with no adverse reactions reported  -She is medically ready for discharge at this time and is to follow up with her local neurologist following discharge  HTN  -continue outpt meds  H/O CVA  -continue statin and plavix  GERD  -continue pepcid   Consultants:  neuro Procedures:  Echo ------------------------------------------------------------ Study Conclusions  - Left ventricle: The cavity size was normal. Systolic function was normal. The estimated ejection fraction was in the range of 55% to 60%. Although no diagnostic regional wall motion abnormality was identified, this possibility cannot be completely excluded on the basis of this study. - Left atrium: The atrium was moderately dilated  EEG This EEG showed minimal generalized nonspecific continuous slowing of cerebral activity. This pattern of slowing can be seen with metabolic as well as degenerative and toxic etiologies. No evidence of an epileptic disorder was demonstrated.    Discharge Exam: Filed Vitals:   11/17/13 0536  BP: 152/78  Pulse: 90  Temp: 98.1 F (36.7 C)  Resp:    Exam:  General: alert & answers appropriately In NAD  Cardiovascular: RRR, nl S1 s2  Respiratory: CTAB  Abdomen: soft +BS NT/ND, no masses palpable  Extremities: No cyanosis and no edema    Discharge Instructions You were cared for by a hospitalist during your hospital stay. If you have any questions about your discharge medications or the care you  received while you were in the hospital after you are discharged, you can call the unit and asked to speak with the hospitalist on call if the hospitalist that took care of you is not available. Once you are discharged, your primary care physician will handle any further medical issues. Please note that NO REFILLS for any discharge medications will be authorized once you are discharged, as it is imperative that you  return to your primary care physician (or establish a relationship with a primary care physician if you do not have one) for your aftercare needs so that they can reassess your need for medications and monitor your lab values.  Discharge Orders   Future Orders Complete By Expires   Diet - low sodium heart healthy  As directed    Increase activity slowly  As directed        Medication List         atorvastatin 10 MG tablet  Commonly known as:  LIPITOR  Take 10 mg by mouth daily.     butalbital-acetaminophen-caffeine 50-325-40 MG per tablet  Commonly known as:  FIORICET, ESGIC  Take 2 tablets by mouth every 6 (six) hours as needed for headache.     clopidogrel 75 MG tablet  Commonly known as:  PLAVIX  Take 75 mg by mouth daily with breakfast.     donepezil 5 MG tablet  Commonly known as:  ARICEPT  Take 5 mg by mouth at bedtime.     famotidine 40 MG tablet  Commonly known as:  PEPCID  Take 40 mg by mouth 3 (three) times daily.     FISH OIL PO  Take 1 capsule by mouth daily.     levETIRAcetam 500 MG tablet  Commonly known as:  KEPPRA  Take 1 tablet (500 mg total) by mouth 2 (two) times daily.     lisinopril 20 MG tablet  Commonly known as:  PRINIVIL,ZESTRIL  Take 20 mg by mouth daily.     MULTIVITAMIN PO  Take 1 tablet by mouth daily.     omeprazole 20 MG capsule  Commonly known as:  PRILOSEC  Take 20 mg by mouth 2 (two) times daily.     oxyCODONE-acetaminophen 7.5-325 MG per tablet  Commonly known as:  PERCOCET  Take 1 tablet by mouth every 6 (six) hours as needed for pain.     risperiDONE 0.5 MG tablet  Commonly known as:  RISPERDAL  Take 0.5 mg by mouth at bedtime.     VITAMIN D-3 PO  Take 1 capsule by mouth daily.     zolpidem 10 MG tablet  Commonly known as:  AMBIEN  Take 0.5 tablets (5 mg total) by mouth at bedtime as needed for sleep.       Allergies  Allergen Reactions  . Plaquenil [Hydroxychloroquine Sulfate] Rash       Follow-up  Information   Follow up with Patricia Nettle, MD. (in 1-2weeks, call for appt upon discharge)    Specialty:  Family Medicine   Contact information:   [address] [place] [address] [phone]       Follow up with Karl Luke, MD. (in 3-4wks or as scheduled)    Specialty:  Neurology   Contact information:   [place] [address] [phone]        The results of significant diagnostics from this hospitalization (including imaging, microbiology, ancillary and laboratory) are listed below for reference.    Significant Diagnostic  Studies: Ct Head Wo Contrast  11/15/2013   CLINICAL DATA:  Mental status change, slurred speech, and unsteady gait.  EXAM: CT HEAD WITHOUT CONTRAST  TECHNIQUE: Contiguous axial images were obtained from the base of the skull through the vertex without intravenous contrast.  COMPARISON:  CT HEAD W/O CM dated 08/20/2013  FINDINGS: There is mild diffuse cerebral and cerebellar atrophy with compensatory ventriculomegaly. There is decreased density in the deep white matter of both cerebral hemispheres consistent with chronic small vessel ischemic type change. There is an old lacunar infarction in the posterior aspect of the left basal ganglia. There is no evidence of an acute intracranial hemorrhage nor of an evolving ischemic event. The cerebellum and brainstem are normal in density.  At bone window settings the observed portions of the paranasal sinuses and mastoid air cells are clear. There is no evidence of an acute skull fracture.  IMPRESSION: 1. There is no evidence of an acute intracranial hemorrhage nor of an acute ischemic infarction. 2. There are stable chronic changes of small vessel ischemia. 3. There is no evidence of an acute skull fracture.   Electronically Signed   By: David  Martinique   On: 11/15/2013 01:46   Mr Brain Wo Contrast  11/15/2013   CLINICAL DATA:  79 year old female with episode of acute weakness and  confusion. Initial encounter.  EXAM: MRI HEAD WITHOUT CONTRAST  MRA HEAD WITHOUT CONTRAST  TECHNIQUE: Multiplanar, multiecho pulse sequences of the brain and surrounding structures were obtained without intravenous contrast. Angiographic images of the head were obtained using MRA technique without contrast.  COMPARISON:  Head CT without contrast 0130 hr today. Brain MRI and MRA 07/08/2013.  FINDINGS: MRI HEAD FINDINGS  Stable cerebral volume. Major intracranial vascular flow voids are stable. No restricted diffusion to suggest acute infarction. No midline shift, mass effect, evidence of mass lesion, ventriculomegaly, extra-axial collection or acute intracranial hemorrhage. Cervicomedullary junction and pituitary are within normal limits. Negative visualized cervical spine. Stable and normal bone marrow signal.  Interval expected evolution of the right periatrial white matter infarct that occurred in December. Otherwise stable gray and white matter signal throughout the brain. Patchy and confluent cerebral white matter T2 and FLAIR hyperintensity. Chronic lacunar infarcts in the deep gray matter.  Stable orbits soft tissues. Stable paranasal sinuses and mastoids. Visualized scalp soft tissues are within normal limits.  MRA HEAD FINDINGS  Stable antegrade flow in the posterior circulation with dominant distal right vertebral artery. Normal right PICA. Normal vertebrobasilar junction. Dominant right AICA. No basilar stenosis. Normal SCA and PCA origins. Posterior communicating arteries are diminutive or absent. Bilateral PCA branches are stable.  Stable antegrade flow in both ICA siphons. No ICA stenosis. Ophthalmic artery origins are within normal limits. Stable and normal carotid termini. Dominant right ACA A1 segment again noted. MCA origins remain normal. Anterior communicating artery and visualized ACA branches are within normal limits, median artery of the corpus callosum present. Visualized bilateral MCA  branches are stable and within normal limits.  IMPRESSION: 1. No acute intracranial abnormality. Chronic small vessel ischemia. 2. Stable, negative intracranial MRA.   Electronically Signed   By: Lars Pinks M.D.   On: 11/15/2013 12:07   Dg Chest Port 1 View  11/15/2013   CLINICAL DATA:  Shortness of breath and weakness.  EXAM: PORTABLE CHEST - 1 VIEW  COMPARISON:  Chest radiograph performed 07/07/2013  FINDINGS: The lungs are well-aerated. Minimal bibasilar atelectasis is noted. There is no evidence of pleural effusion or pneumothorax.  The cardiomediastinal silhouette is borderline normal in size. No acute osseous abnormalities are seen.  IMPRESSION: Minimal bibasilar atelectasis noted; lungs otherwise clear.   Electronically Signed   By: Garald Balding M.D.   On: 11/15/2013 02:59   Mr Jodene Nam Head/brain Wo Cm  11/15/2013   CLINICAL DATA:  79 year old female with episode of acute weakness and confusion. Initial encounter.  EXAM: MRI HEAD WITHOUT CONTRAST  MRA HEAD WITHOUT CONTRAST  TECHNIQUE: Multiplanar, multiecho pulse sequences of the brain and surrounding structures were obtained without intravenous contrast. Angiographic images of the head were obtained using MRA technique without contrast.  COMPARISON:  Head CT without contrast 0130 hr today. Brain MRI and MRA 07/08/2013.  FINDINGS: MRI HEAD FINDINGS  Stable cerebral volume. Major intracranial vascular flow voids are stable. No restricted diffusion to suggest acute infarction. No midline shift, mass effect, evidence of mass lesion, ventriculomegaly, extra-axial collection or acute intracranial hemorrhage. Cervicomedullary junction and pituitary are within normal limits. Negative visualized cervical spine. Stable and normal bone marrow signal.  Interval expected evolution of the right periatrial white matter infarct that occurred in December. Otherwise stable gray and white matter signal throughout the brain. Patchy and confluent cerebral white matter T2 and  FLAIR hyperintensity. Chronic lacunar infarcts in the deep gray matter.  Stable orbits soft tissues. Stable paranasal sinuses and mastoids. Visualized scalp soft tissues are within normal limits.  MRA HEAD FINDINGS  Stable antegrade flow in the posterior circulation with dominant distal right vertebral artery. Normal right PICA. Normal vertebrobasilar junction. Dominant right AICA. No basilar stenosis. Normal SCA and PCA origins. Posterior communicating arteries are diminutive or absent. Bilateral PCA branches are stable.  Stable antegrade flow in both ICA siphons. No ICA stenosis. Ophthalmic artery origins are within normal limits. Stable and normal carotid termini. Dominant right ACA A1 segment again noted. MCA origins remain normal. Anterior communicating artery and visualized ACA branches are within normal limits, median artery of the corpus callosum present. Visualized bilateral MCA branches are stable and within normal limits.  IMPRESSION: 1. No acute intracranial abnormality. Chronic small vessel ischemia. 2. Stable, negative intracranial MRA.   Electronically Signed   By: Lars Pinks M.D.   On: 11/15/2013 12:07    Microbiology: No results found for this or any previous visit (from the past 240 hour(s)).   Labs: Basic Metabolic Panel:  Recent Labs Lab 11/15/13 0140  NA 141  K 3.6*  CL 105  CO2 23  GLUCOSE 105*  BUN 14  CREATININE 1.14*  CALCIUM 9.0   Liver Function Tests:  Recent Labs Lab 11/15/13 0140  AST 15  ALT 11  ALKPHOS 67  BILITOT 0.3  PROT 6.6  ALBUMIN 3.7   No results found for this basename: LIPASE, AMYLASE,  in the last 168 hours No results found for this basename: AMMONIA,  in the last 168 hours CBC:  Recent Labs Lab 11/15/13 0140  WBC 7.4  HGB 10.8*  HCT 33.5*  MCV 89.8  PLT 207   Cardiac Enzymes: No results found for this basename: CKTOTAL, CKMB, CKMBINDEX, TROPONINI,  in the last 168 hours BNP: BNP (last 3 results) No results found for this  basename: PROBNP,  in the last 8760 hours CBG:  Recent Labs Lab 11/15/13 0212  GLUCAP 102*       Signed:  Keilana Morlock C Aarush Stukey  [facility] 11/17/2013, 9:43 AM

## 2013-11-17 NOTE — Progress Notes (Signed)
Patient d/c this am with assessments remaining unchanged. Was educated on the s/s of stroke and the need to call 911 when needed. Stroke Handout was given as well to help her read on her free time.

## 2013-12-15 NOTE — Progress Notes (Signed)
Occupational Therapy Evaluation Addendum:   Nov 20, 2013 1400  OT G-codes **NOT FOR INPATIENT CLASS**  Functional Limitation Self care  Self Care Current Status [phone]) [facility]  Self Care Goal Status (T0240) [facility]  Self Care Discharge Status [phone]) [place], OTR/L [phone]

## 2015-01-14 ENCOUNTER — Other Ambulatory Visit: Payer: Self-pay

## 2019-09-22 ENCOUNTER — Emergency Department (HOSPITAL_COMMUNITY): Payer: Medicare Other

## 2019-09-22 ENCOUNTER — Other Ambulatory Visit: Payer: Self-pay

## 2019-09-22 ENCOUNTER — Encounter (HOSPITAL_COMMUNITY): Payer: Self-pay

## 2019-09-22 ENCOUNTER — Emergency Department (HOSPITAL_COMMUNITY)
Admission: EM | Admit: 2019-09-22 | Discharge: 2019-09-22 | Disposition: A | Discharge status: Home / Self Care | Payer: Medicare Other | Attending: Emergency Medicine | Admitting: Emergency Medicine

## 2019-09-22 DIAGNOSIS — M79601 Pain in right arm: Secondary | ICD-10-CM | POA: Diagnosis not present

## 2019-09-22 DIAGNOSIS — Z8669 Personal history of other diseases of the nervous system and sense organs: Secondary | ICD-10-CM | POA: Diagnosis not present

## 2019-09-22 DIAGNOSIS — S22030A Wedge compression fracture of third thoracic vertebra, initial encounter for closed fracture: Secondary | ICD-10-CM

## 2019-09-22 DIAGNOSIS — R0602 Shortness of breath: Secondary | ICD-10-CM | POA: Diagnosis not present

## 2019-09-22 DIAGNOSIS — R0789 Other chest pain: Secondary | ICD-10-CM | POA: Insufficient documentation

## 2019-09-22 DIAGNOSIS — Y999 Unspecified external cause status: Secondary | ICD-10-CM | POA: Diagnosis not present

## 2019-09-22 DIAGNOSIS — I10 Essential (primary) hypertension: Secondary | ICD-10-CM | POA: Insufficient documentation

## 2019-09-22 DIAGNOSIS — Z79899 Other long term (current) drug therapy: Secondary | ICD-10-CM | POA: Insufficient documentation

## 2019-09-22 DIAGNOSIS — Y939 Activity, unspecified: Secondary | ICD-10-CM | POA: Diagnosis not present

## 2019-09-22 DIAGNOSIS — M3210 Systemic lupus erythematosus, organ or system involvement unspecified: Secondary | ICD-10-CM | POA: Diagnosis not present

## 2019-09-22 DIAGNOSIS — Y929 Unspecified place or not applicable: Secondary | ICD-10-CM | POA: Diagnosis not present

## 2019-09-22 DIAGNOSIS — X58XXXA Exposure to other specified factors, initial encounter: Secondary | ICD-10-CM | POA: Insufficient documentation

## 2019-09-22 LAB — CBC WITH DIFFERENTIAL/PLATELET
Abs Immature Granulocytes: 0.06 10*3/uL (ref 0.00–0.07)
Basophils Absolute: 0.1 10*3/uL (ref 0.0–0.1)
Basophils Relative: 1 %
Eosinophils Absolute: 0.6 10*3/uL — ABNORMAL HIGH (ref 0.0–0.5)
Eosinophils Relative: 5 %
HCT: 37.3 % (ref 36.0–46.0)
Hemoglobin: 11.4 g/dL — ABNORMAL LOW (ref 12.0–15.0)
Immature Granulocytes: 1 %
Lymphocytes Relative: 19 %
Lymphs Abs: 2.2 10*3/uL (ref 0.7–4.0)
MCH: 29.8 pg (ref 26.0–34.0)
MCHC: 30.6 g/dL (ref 30.0–36.0)
MCV: 97.4 fL (ref 80.0–100.0)
Monocytes Absolute: 1.2 10*3/uL — ABNORMAL HIGH (ref 0.1–1.0)
Monocytes Relative: 10 %
Neutro Abs: 7.8 10*3/uL — ABNORMAL HIGH (ref 1.7–7.7)
Neutrophils Relative %: 64 %
Platelets: 228 10*3/uL (ref 150–400)
RBC: 3.83 MIL/uL — ABNORMAL LOW (ref 3.87–5.11)
RDW: 17.7 % — ABNORMAL HIGH (ref 11.5–15.5)
WBC: 12 10*3/uL — ABNORMAL HIGH (ref 4.0–10.5)
nRBC: 0 % (ref 0.0–0.2)

## 2019-09-22 LAB — COMPREHENSIVE METABOLIC PANEL
ALT: 15 U/L (ref 0–44)
AST: 28 U/L (ref 15–41)
Albumin: 3.6 g/dL (ref 3.5–5.0)
Alkaline Phosphatase: 59 U/L (ref 38–126)
Anion gap: 11 (ref 5–15)
BUN: 39 mg/dL — ABNORMAL HIGH (ref 8–23)
CO2: 24 mmol/L (ref 22–32)
Calcium: 9.4 mg/dL (ref 8.9–10.3)
Chloride: 106 mmol/L (ref 98–111)
Creatinine, Ser: 0.85 mg/dL (ref 0.44–1.00)
GFR calc Af Amer: >60 mL/min (ref >60)
GFR calc non Af Amer: >60 mL/min (ref >60)
Glucose, Bld: 113 mg/dL — ABNORMAL HIGH (ref 70–99)
Potassium: 3.9 mmol/L (ref 3.5–5.1)
Sodium: 141 mmol/L (ref 135–145)
Total Bilirubin: 0.8 mg/dL (ref 0.3–1.2)
Total Protein: 6 g/dL — ABNORMAL LOW (ref 6.5–8.1)

## 2019-09-22 LAB — TROPONIN I (HIGH SENSITIVITY)
Troponin I (High Sensitivity): 5 ng/L (ref <18)
Troponin I (High Sensitivity): 8 ng/L (ref <18)

## 2019-09-22 LAB — D-DIMER, QUANTITATIVE (NOT AT ARMC): D-Dimer, Quant: >20 ug/mL-FEU — ABNORMAL HIGH (ref 0.00–0.50)

## 2019-09-22 LAB — BRAIN NATRIURETIC PEPTIDE: B Natriuretic Peptide: 28.4 pg/mL (ref 0.0–100.0)

## 2019-09-22 MED ORDER — IOHEXOL 350 MG/ML SOLN
75.0000 mL | Freq: Once | INTRAVENOUS | Status: AC | PRN
  Administered 2019-09-22: 75 mL via INTRAVENOUS

## 2019-09-22 NOTE — ED Provider Notes (Signed)
[facility] EMERGENCY DEPARTMENT Provider Note   CSN: [id number] Arrival date & time: 09/22/19  0321     History Chief Complaint  Patient presents with  . Chest Pain  . Cough  . Weakness    LURLIE MOOTY is a 85 y.o. female.  Patient here with EMS with right-sided chest pain that radiates into her right arm.  This onset last night around 11 PM as she was lying in bed.  She reports the pain is constant.  Is gradually improved with receiving aspirin,nitroglycerin, morphine from EMS.  Patient with a history of stroke with right-sided weakness.  She reports this is unchanged.  She reports pain to her right chest wall that is worse with palpation and movement.  She denies any sweating, nausea, vomiting or diaphoresis.  She did feel some shortness of breath and tightness in her chest. No leg pain or leg swelling.  Denies any cardiac history.  Thinks she had a stress test many years ago.  The history is provided by the patient and the EMS personnel.       Past Medical History:  Diagnosis Date  . GERD (gastroesophageal reflux disease)   . Hypertension   . Lupus ([place])   . Stroke ([place])   . TIA (transient ischemic attack)     Patient Active Problem List   Diagnosis Date Noted  . Generalized weakness 11/15/2013  . Confusion 11/15/2013  . CVA (cerebral infarction) 07/09/2013  . Slurred speech 07/08/2013  . Fall 07/08/2013  . Hypertension 07/08/2013  . Chronic pain syndrome 07/08/2013  . Hypokalemia 07/08/2013  . TIA (transient ischemic attack) 07/07/2013    Past Surgical History:  Procedure Laterality Date  . APPENDECTOMY    . ESOPHAGOGASTRODUODENOSCOPY    . TEE WITHOUT CARDIOVERSION N/A 07/10/2013   Procedure: TRANSESOPHAGEAL ECHOCARDIOGRAM (TEE);  Surgeon: Thayer Headings, MD;  Location: [facility] ENDOSCOPY;  Service: Cardiovascular;  Laterality: N/A;     OB History   No obstetric history on file.     No family history on file.  Social History    Tobacco Use  . Smoking status: Never Smoker  . Smokeless tobacco: Never Used  Substance Use Topics  . Alcohol use: No  . Drug use: No    Home Medications Prior to Admission medications   Medication Sig Start Date End Date Taking? Authorizing Provider  atorvastatin (LIPITOR) 10 MG tablet Take 10 mg by mouth daily.    [provider]  butalbital-acetaminophen-caffeine (FIORICET, ESGIC) 50-325-40 MG per tablet Take 2 tablets by mouth every 6 (six) hours as needed for headache.     [provider]  Cholecalciferol (VITAMIN D-3 PO) Take 1 capsule by mouth daily.    [provider]  clopidogrel (PLAVIX) 75 MG tablet Take 75 mg by mouth daily with breakfast.    [provider]  donepezil (ARICEPT) 5 MG tablet Take 5 mg by mouth at bedtime.    [provider]  famotidine (PEPCID) 40 MG tablet Take 40 mg by mouth 3 (three) times daily.    [provider]  levETIRAcetam (KEPPRA) 500 MG tablet Take 1 tablet (500 mg total) by mouth 2 (two) times daily. 11/17/13   Viyuoh, Adeline C, MD  lisinopril (PRINIVIL,ZESTRIL) 20 MG tablet Take 20 mg by mouth daily.    [provider]  Multiple Vitamins-Minerals (MULTIVITAMIN PO) Take 1 tablet by mouth daily.    [provider]  Omega-3 Fatty Acids (FISH OIL PO) Take 1 capsule by  mouth daily.    [provider]  omeprazole (PRILOSEC) 20 MG capsule Take 20 mg by mouth 2 (two) times daily.     [provider]  oxyCODONE-acetaminophen (PERCOCET) 7.5-325 MG per tablet Take 1 tablet by mouth every 6 (six) hours as needed for pain.     [provider]  risperiDONE (RISPERDAL) 0.5 MG tablet Take 0.5 mg by mouth at bedtime.    [provider]  zolpidem (AMBIEN) 10 MG tablet Take 0.5 tablets (5 mg total) by mouth at bedtime as needed for sleep. 07/09/13   Dhungel, Flonnie Overman, MD    Allergies    Plaquenil [hydroxychloroquine sulfate]  Review of Systems   Review of  Systems  Constitutional: Positive for fatigue. Negative for activity change, appetite change and fever.  HENT: Negative for congestion and rhinorrhea.   Eyes: Negative for visual disturbance.  Respiratory: Positive for cough, chest tightness and shortness of breath.   Cardiovascular: Positive for chest pain.  Gastrointestinal: Negative for abdominal pain, nausea and vomiting.  Genitourinary: Negative for dysuria and hematuria.  Musculoskeletal: Negative for arthralgias and myalgias.  Skin: Negative for rash.  Neurological: Negative for dizziness, weakness and headaches.   all other systems are negative except as noted in the HPI and PMH.   Physical Exam Updated Vital Signs BP (!) 147/78   Pulse 76   Temp 97.7 F (36.5 C) (Oral)   Resp 13   Ht 5\' 3"  (1.6 m)   Wt 48.5 kg   SpO2 96%   BMI 18.95 kg/m   Physical Exam Vitals and nursing note reviewed.  Constitutional:      General: She is not in acute distress.    Appearance: Normal appearance. She is well-developed and normal weight. She is not ill-appearing.  HENT:     Head: Normocephalic and atraumatic.     Mouth/Throat:     Pharynx: No oropharyngeal exudate.  Eyes:     Conjunctiva/sclera: Conjunctivae normal.     Pupils: Pupils are equal, round, and reactive to light.  Neck:     Comments: No meningismus. Cardiovascular:     Rate and Rhythm: Normal rate and regular rhythm.     Heart sounds: Normal heart sounds. No murmur.     Comments: Tenderness to palpation of right chest wall and right lateral ribs.  No appreciable rash. Pulmonary:     Effort: Pulmonary effort is normal. No respiratory distress.     Breath sounds: Normal breath sounds.  Chest:     Chest wall: Tenderness present.  Abdominal:     Palpations: Abdomen is soft.     Tenderness: There is no abdominal tenderness. There is no guarding or rebound.  Musculoskeletal:        General: No tenderness. Normal range of motion.     Cervical back: Normal range of  motion and neck supple.  Skin:    General: Skin is warm.  Neurological:     Mental Status: She is alert and oriented to person, place, and time.     Cranial Nerves: No cranial nerve deficit.     Motor: No abnormal muscle tone.     Coordination: Coordination normal.     Comments:  5/5 strength throughout. CN 2-12 intact.Equal grip strength.   Psychiatric:        Behavior: Behavior normal.     ED Results / Procedures / Treatments   Labs (all labs ordered are listed, but only abnormal results are displayed) Labs Reviewed  CBC WITH DIFFERENTIAL/PLATELET -  Abnormal; Notable for the following components:      Result Value   WBC 12.0 (*)    RBC 3.83 (*)    Hemoglobin 11.4 (*)    RDW 17.7 (*)    Neutro Abs 7.8 (*)    Monocytes Absolute 1.2 (*)    Eosinophils Absolute 0.6 (*)    All other components within normal limits  COMPREHENSIVE METABOLIC PANEL - Abnormal; Notable for the following components:   Glucose, Bld 113 (*)    BUN 39 (*)    Total Protein 6.0 (*)    All other components within normal limits  BRAIN NATRIURETIC PEPTIDE  D-DIMER, QUANTITATIVE (NOT AT [facility])  TROPONIN I (HIGH SENSITIVITY)  TROPONIN I (HIGH SENSITIVITY)    EKG EKG Interpretation  Date/Time:  Friday September 22 2019 03:48:15 EST Ventricular Rate:  76 PR Interval:    QRS Duration: 79 QT Interval:  422 QTC Calculation: 475 R Axis:   -28 Text Interpretation: Sinus rhythm Borderline left axis deviation Borderline T abnormalities, anterior leads No significant change was found Confirmed by Ezequiel Essex [phone]) on 09/22/2019 3:55:25 AM   Radiology DG Chest Portable 1 View  Result Date: 09/22/2019 CLINICAL DATA:  Right-sided chest pain. Cough. EXAM: PORTABLE CHEST 1 VIEW COMPARISON:  Two-view chest x-ray 04/03/2019 at [place] [facility] FINDINGS: Heart is mildly enlarged, exaggerated by low lung volumes. Atherosclerotic changes are noted at the aortic arch. Mild bibasilar atelectasis  present. Lungs are clear. Visualized soft tissues and bony thorax are unremarkable. IMPRESSION: 1. No acute cardiopulmonary disease. 2. Aortic atherosclerosis. Electronically Signed   By: San Morelle M.D.   On: 09/22/2019 04:25    Procedures Procedures (including critical care time)  Medications Ordered in ED Medications - No data to display  ED Course  I have reviewed the triage vital signs and the nursing notes.  Pertinent labs & imaging results that were available during my care of the patient were reviewed by me and considered in my medical decision making (see chart for details).    MDM Rules/Calculators/A&P                     Patient with right-sided chest pain since approximately 11 PM.  Pain radiates into her right arm.  She does have right arm weakness from previous stroke which is unchanged.  EKG is nonischemic.  Chest x-ray is negative.  Troponin negative. Patient given aspirin, nitroglycerin and morphine by EMS.  She does take Plavix for her history of stroke.  Denies any cardiac history.  Consider pulmonary embolism.  There is no evidence of pneumonia on chest x-ray.  No evidence of rash.  ACS seems less likely given her ongoing pain and negative heart enzymes.  D-dimer pending.  Care transferred at shift change to Dr. Alvino Chapel.  May need CTA.  Favor musculoskeletal chest pain, possibly zoster though there is no evidence of rash at this point.    Final Clinical Impression(s) / ED Diagnoses Final diagnoses:  None    Rx / DC Orders ED Discharge Orders    None       Taelyn Nemes, Annie Main, MD 09/22/19 [phone]

## 2019-09-22 NOTE — ED Provider Notes (Signed)
  Physical Exam  BP (!) 150/72   Pulse 84   Temp 97.7 F (36.5 C) (Oral)   Resp 17   Ht 5\' 3"  (1.6 m)   Wt 48.5 kg   SpO2 96%   BMI 18.95 kg/m   Physical Exam  ED Course/Procedures     Procedures  MDM  Received patient in signout.  Chest pain going to arm.  Positive D-dimer.  Cardiac work-up reassuring.  CT scan done and showed no PE, but did show T3 compression fracture.  Is tender at the spot.  Potentially could be doing some of the pain although less like to go down the arm.  Patient will follow up as an outpatient.  Discharge home       Davonna Belling, MD 09/22/19 [phone]

## 2019-09-22 NOTE — ED Triage Notes (Signed)
Pt brought to ED from home via EMS with c/o right sided chest pain that radiates to the right shoulder and arm, headache, weakness, and dizziness. Pt states pain began around 2300 last night and has gradually gotten worse. C/o cough x 3 days. EMS admin 4 nitro, 324 mg aspirin, 4 mg morphine. Per EMS, pt takes clopidogrel. Hx of CVA with right sided weakness. Currently A&O x4, pain rated 7/10, shortness of breath present per pt. Denies n/v.

## 2019-11-01 ENCOUNTER — Inpatient Hospital Stay (HOSPITAL_COMMUNITY)
Admission: EM | Admit: 2019-11-01 | Discharge: 2019-11-09 | DRG: 871 | Disposition: A | Discharge status: Skilled Nursing Facility | Payer: Medicare Other | Attending: Internal Medicine | Admitting: Internal Medicine

## 2019-11-01 ENCOUNTER — Emergency Department (HOSPITAL_COMMUNITY): Payer: Medicare Other

## 2019-11-01 DIAGNOSIS — Z7983 Long term (current) use of bisphosphonates: Secondary | ICD-10-CM

## 2019-11-01 DIAGNOSIS — F0280 Dementia in other diseases classified elsewhere without behavioral disturbance: Secondary | ICD-10-CM | POA: Diagnosis present

## 2019-11-01 DIAGNOSIS — Z8673 Personal history of transient ischemic attack (TIA), and cerebral infarction without residual deficits: Secondary | ICD-10-CM

## 2019-11-01 DIAGNOSIS — Z7902 Long term (current) use of antithrombotics/antiplatelets: Secondary | ICD-10-CM

## 2019-11-01 DIAGNOSIS — M329 Systemic lupus erythematosus, unspecified: Secondary | ICD-10-CM | POA: Diagnosis present

## 2019-11-01 DIAGNOSIS — R0902 Hypoxemia: Secondary | ICD-10-CM | POA: Diagnosis present

## 2019-11-01 DIAGNOSIS — R609 Edema, unspecified: Secondary | ICD-10-CM

## 2019-11-01 DIAGNOSIS — Z888 Allergy status to other drugs, medicaments and biological substances status: Secondary | ICD-10-CM

## 2019-11-01 DIAGNOSIS — E876 Hypokalemia: Secondary | ICD-10-CM | POA: Diagnosis not present

## 2019-11-01 DIAGNOSIS — D473 Essential (hemorrhagic) thrombocythemia: Secondary | ICD-10-CM

## 2019-11-01 DIAGNOSIS — R7309 Other abnormal glucose: Secondary | ICD-10-CM

## 2019-11-01 DIAGNOSIS — R6510 Systemic inflammatory response syndrome (SIRS) of non-infectious origin without acute organ dysfunction: Secondary | ICD-10-CM | POA: Diagnosis present

## 2019-11-01 DIAGNOSIS — I10 Essential (primary) hypertension: Secondary | ICD-10-CM | POA: Diagnosis present

## 2019-11-01 DIAGNOSIS — D649 Anemia, unspecified: Secondary | ICD-10-CM

## 2019-11-01 DIAGNOSIS — G894 Chronic pain syndrome: Secondary | ICD-10-CM | POA: Diagnosis present

## 2019-11-01 DIAGNOSIS — R7989 Other specified abnormal findings of blood chemistry: Secondary | ICD-10-CM

## 2019-11-01 DIAGNOSIS — A409 Streptococcal sepsis, unspecified: Secondary | ICD-10-CM | POA: Diagnosis not present

## 2019-11-01 DIAGNOSIS — G309 Alzheimer's disease, unspecified: Secondary | ICD-10-CM | POA: Diagnosis present

## 2019-11-01 DIAGNOSIS — K838 Other specified diseases of biliary tract: Secondary | ICD-10-CM | POA: Diagnosis present

## 2019-11-01 DIAGNOSIS — Z20822 Contact with and (suspected) exposure to covid-19: Secondary | ICD-10-CM | POA: Diagnosis present

## 2019-11-01 DIAGNOSIS — E43 Unspecified severe protein-calorie malnutrition: Secondary | ICD-10-CM | POA: Diagnosis present

## 2019-11-01 DIAGNOSIS — J189 Pneumonia, unspecified organism: Secondary | ICD-10-CM

## 2019-11-01 DIAGNOSIS — G9341 Metabolic encephalopathy: Secondary | ICD-10-CM | POA: Diagnosis present

## 2019-11-01 DIAGNOSIS — D72829 Elevated white blood cell count, unspecified: Secondary | ICD-10-CM

## 2019-11-01 DIAGNOSIS — D75839 Thrombocytosis, unspecified: Secondary | ICD-10-CM

## 2019-11-01 DIAGNOSIS — J13 Pneumonia due to Streptococcus pneumoniae: Secondary | ICD-10-CM | POA: Diagnosis present

## 2019-11-01 DIAGNOSIS — K219 Gastro-esophageal reflux disease without esophagitis: Secondary | ICD-10-CM | POA: Diagnosis present

## 2019-11-01 DIAGNOSIS — R600 Localized edema: Secondary | ICD-10-CM | POA: Diagnosis present

## 2019-11-01 DIAGNOSIS — R4182 Altered mental status, unspecified: Secondary | ICD-10-CM | POA: Diagnosis present

## 2019-11-01 DIAGNOSIS — Z681 Body mass index (BMI) 19 or less, adult: Secondary | ICD-10-CM

## 2019-11-01 DIAGNOSIS — Z79899 Other long term (current) drug therapy: Secondary | ICD-10-CM

## 2019-11-01 DIAGNOSIS — Z66 Do not resuscitate: Secondary | ICD-10-CM | POA: Diagnosis present

## 2019-11-01 DIAGNOSIS — E782 Mixed hyperlipidemia: Secondary | ICD-10-CM | POA: Diagnosis present

## 2019-11-01 DIAGNOSIS — R64 Cachexia: Secondary | ICD-10-CM | POA: Diagnosis present

## 2019-11-01 DIAGNOSIS — E875 Hyperkalemia: Secondary | ICD-10-CM | POA: Diagnosis not present

## 2019-11-01 DIAGNOSIS — K8050 Calculus of bile duct without cholangitis or cholecystitis without obstruction: Secondary | ICD-10-CM

## 2019-11-01 MED ORDER — SODIUM CHLORIDE 0.9 % IV SOLN
500.0000 mg | INTRAVENOUS | Status: AC
  Administered 2019-11-02 – 2019-11-05 (×5): 500 mg via INTRAVENOUS
  Filled 2019-11-01 (×5): qty 500

## 2019-11-01 MED ORDER — SODIUM CHLORIDE 0.9 % IV SOLN
2.0000 g | INTRAVENOUS | Status: DC
  Administered 2019-11-02: 2 g via INTRAVENOUS
  Filled 2019-11-01: qty 20

## 2019-11-01 NOTE — ED Provider Notes (Signed)
[facility] EMERGENCY DEPARTMENT Provider Note   CSN: [id number] Arrival date & time: 11/01/19  2301   History Chief Complaint  Patient presents with  . Blood Infection    Kim Blevins is a 85 y.o. female.  The history is provided by the patient and the EMS personnel.  She has history of hypertension, lupus, stroke and comes in from home with the diagnosis of pneumonia and fever at home.  Temperature at home as reported been reported to be 101.  She had recently been hospitalized for pneumonia and saw her primary care provider today and was given a prescription for levofloxacin which she is to start tomorrow.  Apparently, she did receive an antibiotic injection in the doctor's office.  Patient denies cough or dyspnea.  She is complaining of some aching in her right shoulder.  She denies nausea, vomiting, diarrhea.  She denies any urinary difficulty.  She has been vaccinated against COVID-19 he denies any exposure to COVID-19.  Past Medical History:  Diagnosis Date  . GERD (gastroesophageal reflux disease)   . Hypertension   . Lupus ([place])   . Stroke ([place])   . TIA (transient ischemic attack)     Patient Active Problem List   Diagnosis Date Noted  . Generalized weakness 11/15/2013  . Confusion 11/15/2013  . CVA (cerebral infarction) 07/09/2013  . Slurred speech 07/08/2013  . Fall 07/08/2013  . Hypertension 07/08/2013  . Chronic pain syndrome 07/08/2013  . Hypokalemia 07/08/2013  . TIA (transient ischemic attack) 07/07/2013    Past Surgical History:  Procedure Laterality Date  . APPENDECTOMY    . ESOPHAGOGASTRODUODENOSCOPY    . TEE WITHOUT CARDIOVERSION N/A 07/10/2013   Procedure: TRANSESOPHAGEAL ECHOCARDIOGRAM (TEE);  Surgeon: Thayer Headings, MD;  Location: [facility] ENDOSCOPY;  Service: Cardiovascular;  Laterality: N/A;     OB History   No obstetric history on file.     No family history on file.  Social History   Tobacco Use  . Smoking status:  Never Smoker  . Smokeless tobacco: Never Used  Substance Use Topics  . Alcohol use: No  . Drug use: No    Home Medications Prior to Admission medications   Medication Sig Start Date End Date Taking? Authorizing Provider  atorvastatin (LIPITOR) 10 MG tablet Take 10 mg by mouth daily.    [provider]  butalbital-acetaminophen-caffeine (FIORICET, ESGIC) 50-325-40 MG per tablet Take 2 tablets by mouth every 6 (six) hours as needed for headache.     [provider]  Cholecalciferol (VITAMIN D-3 PO) Take 1 capsule by mouth daily.    [provider]  clopidogrel (PLAVIX) 75 MG tablet Take 75 mg by mouth daily with breakfast.    [provider]  donepezil (ARICEPT) 5 MG tablet Take 5 mg by mouth at bedtime.    [provider]  famotidine (PEPCID) 40 MG tablet Take 40 mg by mouth 3 (three) times daily.    [provider]  levETIRAcetam (KEPPRA) 500 MG tablet Take 1 tablet (500 mg total) by mouth 2 (two) times daily. 11/17/13   Viyuoh, Adeline C, MD  lisinopril (PRINIVIL,ZESTRIL) 20 MG tablet Take 20 mg by mouth daily.    [provider]  Multiple Vitamins-Minerals (MULTIVITAMIN PO) Take 1 tablet by mouth daily.    [provider]  Omega-3 Fatty Acids (FISH OIL PO) Take 1 capsule by mouth daily.    [provider]  omeprazole (PRILOSEC) 20 MG capsule Take 20 mg by mouth  2 (two) times daily.     [provider]  oxyCODONE-acetaminophen (PERCOCET) 7.5-325 MG per tablet Take 1 tablet by mouth every 6 (six) hours as needed for pain.     [provider]  risperiDONE (RISPERDAL) 0.5 MG tablet Take 0.5 mg by mouth at bedtime.    [provider]  zolpidem (AMBIEN) 10 MG tablet Take 0.5 tablets (5 mg total) by mouth at bedtime as needed for sleep. 07/09/13   Dhungel, Flonnie Overman, MD    Allergies    Plaquenil [hydroxychloroquine sulfate]  Review of Systems   Review of Systems  All other systems  reviewed and are negative.   Physical Exam Updated Vital Signs BP (!) 167/87 (BP Location: Right Arm)   Pulse (!) 109   Temp 98 F (36.7 C) (Oral)   Resp 11   Ht 5\' 3"  (1.6 m)   Wt 47.6 kg   SpO2 97%   BMI 18.60 kg/m   Physical Exam Vitals and nursing note reviewed.   Somewhat cachectic 85 year old female, resting comfortably and in no acute distress. Vital signs are significant for elevated blood pressure and heart rate. Oxygen saturation is 97%, which is normal. Head is normocephalic and atraumatic. PERRLA, EOMI. Oropharynx is clear. Neck is nontender and supple without adenopathy or JVD. Back is nontender and there is no CVA tenderness. Lungs have faint rales at the left base without wheezes or rhonchi. Chest is nontender. Heart has regular rate and rhythm with [id number] systolic ejection murmur heard at the upper right sternal border. Abdomen is soft, flat, nontender without masses or hepatosplenomegaly and peristalsis is normoactive. Extremities have no cyanosis or edema, full range of motion is present. Skin is warm and dry without rash. Neurologic: Mental status is normal, cranial nerves are intact, there are no motor or sensory deficits.  ED Results / Procedures / Treatments   Labs (all labs ordered are listed, but only abnormal results are displayed) Labs Reviewed - No data to display  EKG None  Radiology No results found.  Procedures Procedures  CRITICAL CARE Performed by: Delora Fuel Total critical care time: 45 minutes Critical care time was exclusive of separately billable procedures and treating other patients. Critical care was necessary to treat or prevent imminent or life-threatening deterioration. Critical care was time spent personally by me on the following activities: development of treatment plan with patient and/or surrogate as well as nursing, discussions with consultants, evaluation of patient's response to treatment, examination of patient,  obtaining history from patient or surrogate, ordering and performing treatments and interventions, ordering and review of laboratory studies, ordering and review of radiographic studies, pulse oximetry and re-evaluation of patient's condition.  Medications Ordered in ED Medications  cefTRIAXone (ROCEPHIN) 2 g in sodium chloride 0.9 % 100 mL IVPB (has no administration in time range)  azithromycin (ZITHROMAX) 500 mg in sodium chloride 0.9 % 250 mL IVPB (has no administration in time range)    ED Course  I have reviewed the triage vital signs and the nursing notes.  Pertinent labs & imaging results that were available during my care of the patient were reviewed by me and considered in my medical decision making (see chart for details).  Patient sent to the ED with diagnosis of presumed sepsis.  Old records are reviewed, and she had been hospitalized from April 2 through April 4 at [facility].  Hospitalization was for pneumonia.  She was treated with ceftriaxone and azithromycin.  Chest x-ray done  April 12 showed partial clearing of previous right middle lobe pneumonia.  She saw her primary care provider today and was given an injection of ceftriaxone and is prescription for levofloxacin as well as megestrol.  Labs from earlier today showed elevated transaminases, alkaline phosphatase and hyponatremia and hypokalemia.  WBC was markedly elevated to 20 with a slight left shift.  Patient does not appear toxic at this time, but will start on sepsis pathway for presumed persistent pneumonia.  This is a condition with high potential morbidity.  Chest x-ray shows right upper lobe infiltrate and appears to show some volume loss.  WBC is only mildly elevated today at 12.2 with a left shift.  Mild anemia is present and unchanged from baseline.  Transaminases and alkaline phosphatase are mildly elevated and not significantly changed from prior results on care everywhere.  Thrombocytosis is  present and felt to be reactive.  Lactic acid level is normal.  Patient's husband has arrived and states that she has DNR paperwork although he did not bring it with them.  Her heart rate is staying persistently elevated and it is felt that she would benefit from inpatient care.  Case is discussed with Dr. Cyd Silence of [facility], who agrees to admit the patient.  MDM Rules/Calculators/A&P  Final Clinical Impression(s) / ED Diagnoses Final diagnoses:  Community acquired pneumonia of right upper lobe of lung  Normochromic normocytic anemia  Elevated random blood glucose level  Elevated liver function tests  Thrombocytosis [facility])    Rx / DC Orders ED Discharge Orders    None       Delora Fuel, MD [phone]

## 2019-11-01 NOTE — ED Triage Notes (Signed)
Pt brought in via [facility] stretcher. Pt reportedly went to PCP today and was diagnosed with PNA, fvr, and gout in L foot (paperwork with pt). Per family, MD called and told her to come to hospital for sepsis. Pt A+Ox4, no pain, afebrile according to EMS. EMS gave 1 L NS bolus en-route

## 2019-11-02 ENCOUNTER — Encounter (HOSPITAL_COMMUNITY): Payer: Self-pay | Admitting: Internal Medicine

## 2019-11-02 ENCOUNTER — Observation Stay (HOSPITAL_COMMUNITY): Payer: Medicare Other

## 2019-11-02 ENCOUNTER — Inpatient Hospital Stay (HOSPITAL_COMMUNITY): Payer: Medicare Other

## 2019-11-02 DIAGNOSIS — I361 Nonrheumatic tricuspid (valve) insufficiency: Secondary | ICD-10-CM | POA: Diagnosis not present

## 2019-11-02 DIAGNOSIS — F0280 Dementia in other diseases classified elsewhere without behavioral disturbance: Secondary | ICD-10-CM | POA: Diagnosis present

## 2019-11-02 DIAGNOSIS — J13 Pneumonia due to Streptococcus pneumoniae: Secondary | ICD-10-CM

## 2019-11-02 DIAGNOSIS — E43 Unspecified severe protein-calorie malnutrition: Secondary | ICD-10-CM | POA: Diagnosis present

## 2019-11-02 DIAGNOSIS — Z7902 Long term (current) use of antithrombotics/antiplatelets: Secondary | ICD-10-CM | POA: Diagnosis not present

## 2019-11-02 DIAGNOSIS — R6510 Systemic inflammatory response syndrome (SIRS) of non-infectious origin without acute organ dysfunction: Secondary | ICD-10-CM

## 2019-11-02 DIAGNOSIS — R0902 Hypoxemia: Secondary | ICD-10-CM | POA: Diagnosis present

## 2019-11-02 DIAGNOSIS — E782 Mixed hyperlipidemia: Secondary | ICD-10-CM

## 2019-11-02 DIAGNOSIS — R600 Localized edema: Secondary | ICD-10-CM | POA: Diagnosis not present

## 2019-11-02 DIAGNOSIS — Z7983 Long term (current) use of bisphosphonates: Secondary | ICD-10-CM | POA: Diagnosis not present

## 2019-11-02 DIAGNOSIS — E876 Hypokalemia: Secondary | ICD-10-CM | POA: Diagnosis not present

## 2019-11-02 DIAGNOSIS — G9341 Metabolic encephalopathy: Secondary | ICD-10-CM

## 2019-11-02 DIAGNOSIS — M329 Systemic lupus erythematosus, unspecified: Secondary | ICD-10-CM | POA: Diagnosis present

## 2019-11-02 DIAGNOSIS — Z20822 Contact with and (suspected) exposure to covid-19: Secondary | ICD-10-CM | POA: Diagnosis present

## 2019-11-02 DIAGNOSIS — J189 Pneumonia, unspecified organism: Secondary | ICD-10-CM | POA: Diagnosis not present

## 2019-11-02 DIAGNOSIS — E875 Hyperkalemia: Secondary | ICD-10-CM | POA: Diagnosis not present

## 2019-11-02 DIAGNOSIS — A409 Streptococcal sepsis, unspecified: Secondary | ICD-10-CM | POA: Diagnosis present

## 2019-11-02 DIAGNOSIS — Z681 Body mass index (BMI) 19 or less, adult: Secondary | ICD-10-CM | POA: Diagnosis not present

## 2019-11-02 DIAGNOSIS — K838 Other specified diseases of biliary tract: Secondary | ICD-10-CM

## 2019-11-02 DIAGNOSIS — I10 Essential (primary) hypertension: Secondary | ICD-10-CM

## 2019-11-02 DIAGNOSIS — K219 Gastro-esophageal reflux disease without esophagitis: Secondary | ICD-10-CM | POA: Diagnosis present

## 2019-11-02 DIAGNOSIS — R4182 Altered mental status, unspecified: Secondary | ICD-10-CM | POA: Diagnosis present

## 2019-11-02 DIAGNOSIS — G894 Chronic pain syndrome: Secondary | ICD-10-CM | POA: Diagnosis present

## 2019-11-02 DIAGNOSIS — Z888 Allergy status to other drugs, medicaments and biological substances status: Secondary | ICD-10-CM | POA: Diagnosis not present

## 2019-11-02 DIAGNOSIS — R64 Cachexia: Secondary | ICD-10-CM | POA: Diagnosis present

## 2019-11-02 DIAGNOSIS — K8050 Calculus of bile duct without cholangitis or cholecystitis without obstruction: Secondary | ICD-10-CM | POA: Diagnosis present

## 2019-11-02 DIAGNOSIS — R609 Edema, unspecified: Secondary | ICD-10-CM | POA: Diagnosis present

## 2019-11-02 DIAGNOSIS — Z66 Do not resuscitate: Secondary | ICD-10-CM | POA: Diagnosis present

## 2019-11-02 DIAGNOSIS — G309 Alzheimer's disease, unspecified: Secondary | ICD-10-CM | POA: Diagnosis present

## 2019-11-02 DIAGNOSIS — Z79899 Other long term (current) drug therapy: Secondary | ICD-10-CM | POA: Diagnosis not present

## 2019-11-02 DIAGNOSIS — Z8673 Personal history of transient ischemic attack (TIA), and cerebral infarction without residual deficits: Secondary | ICD-10-CM | POA: Diagnosis not present

## 2019-11-02 LAB — COMPREHENSIVE METABOLIC PANEL
ALT: 87 U/L — ABNORMAL HIGH (ref 0–44)
AST: 185 U/L — ABNORMAL HIGH (ref 15–41)
Albumin: 2.3 g/dL — ABNORMAL LOW (ref 3.5–5.0)
Alkaline Phosphatase: 135 U/L — ABNORMAL HIGH (ref 38–126)
Anion gap: 12 (ref 5–15)
BUN: 19 mg/dL (ref 8–23)
CO2: 28 mmol/L (ref 22–32)
Calcium: 8.9 mg/dL (ref 8.9–10.3)
Chloride: 95 mmol/L — ABNORMAL LOW (ref 98–111)
Creatinine, Ser: 0.88 mg/dL (ref 0.44–1.00)
GFR calc Af Amer: >60 mL/min (ref >60)
GFR calc non Af Amer: >60 mL/min (ref >60)
Glucose, Bld: 148 mg/dL — ABNORMAL HIGH (ref 70–99)
Potassium: 4.2 mmol/L (ref 3.5–5.1)
Sodium: 135 mmol/L (ref 135–145)
Total Bilirubin: 0.8 mg/dL (ref 0.3–1.2)
Total Protein: 6.5 g/dL (ref 6.5–8.1)

## 2019-11-02 LAB — RENAL FUNCTION PANEL
Albumin: 2.1 g/dL — ABNORMAL LOW (ref 3.5–5.0)
Anion gap: 13 (ref 5–15)
BUN: 16 mg/dL (ref 8–23)
CO2: 26 mmol/L (ref 22–32)
Calcium: 8.8 mg/dL — ABNORMAL LOW (ref 8.9–10.3)
Chloride: 99 mmol/L (ref 98–111)
Creatinine, Ser: 0.7 mg/dL (ref 0.44–1.00)
GFR calc Af Amer: >60 mL/min (ref >60)
GFR calc non Af Amer: >60 mL/min (ref >60)
Glucose, Bld: 147 mg/dL — ABNORMAL HIGH (ref 70–99)
Phosphorus: 2.8 mg/dL (ref 2.5–4.6)
Potassium: 3.9 mmol/L (ref 3.5–5.1)
Sodium: 138 mmol/L (ref 135–145)

## 2019-11-02 LAB — CBC WITH DIFFERENTIAL/PLATELET
Abs Immature Granulocytes: 0.1 10*3/uL — ABNORMAL HIGH (ref 0.00–0.07)
Abs Immature Granulocytes: 0.1 10*3/uL — ABNORMAL HIGH (ref 0.00–0.07)
Basophils Absolute: 0 10*3/uL (ref 0.0–0.1)
Basophils Absolute: 0 10*3/uL (ref 0.0–0.1)
Basophils Relative: 0 %
Basophils Relative: 0 %
Eosinophils Absolute: 0 10*3/uL (ref 0.0–0.5)
Eosinophils Absolute: 0 10*3/uL (ref 0.0–0.5)
Eosinophils Relative: 0 %
Eosinophils Relative: 0 %
HCT: 34 % — ABNORMAL LOW (ref 36.0–46.0)
HCT: 36.9 % (ref 36.0–46.0)
Hemoglobin: 10.6 g/dL — ABNORMAL LOW (ref 12.0–15.0)
Hemoglobin: 11.4 g/dL — ABNORMAL LOW (ref 12.0–15.0)
Immature Granulocytes: 1 %
Immature Granulocytes: 1 %
Lymphocytes Relative: 12 %
Lymphocytes Relative: 7 %
Lymphs Abs: 0.8 10*3/uL (ref 0.7–4.0)
Lymphs Abs: 1.3 10*3/uL (ref 0.7–4.0)
MCH: 29.5 pg (ref 26.0–34.0)
MCH: 29.9 pg (ref 26.0–34.0)
MCHC: 30.9 g/dL (ref 30.0–36.0)
MCHC: 31.2 g/dL (ref 30.0–36.0)
MCV: 95.6 fL (ref 80.0–100.0)
MCV: 95.8 fL (ref 80.0–100.0)
Monocytes Absolute: 0.6 10*3/uL (ref 0.1–1.0)
Monocytes Absolute: 1.3 10*3/uL — ABNORMAL HIGH (ref 0.1–1.0)
Monocytes Relative: 12 %
Monocytes Relative: 5 %
Neutro Abs: 10.6 10*3/uL — ABNORMAL HIGH (ref 1.7–7.7)
Neutro Abs: 8.2 10*3/uL — ABNORMAL HIGH (ref 1.7–7.7)
Neutrophils Relative %: 75 %
Neutrophils Relative %: 87 %
Platelets: 399 10*3/uL (ref 150–400)
Platelets: 444 10*3/uL — ABNORMAL HIGH (ref 150–400)
RBC: 3.55 MIL/uL — ABNORMAL LOW (ref 3.87–5.11)
RBC: 3.86 MIL/uL — ABNORMAL LOW (ref 3.87–5.11)
RDW: 15.1 % (ref 11.5–15.5)
RDW: 15.2 % (ref 11.5–15.5)
WBC: 11 10*3/uL — ABNORMAL HIGH (ref 4.0–10.5)
WBC: 12.2 10*3/uL — ABNORMAL HIGH (ref 4.0–10.5)
nRBC: 0 % (ref 0.0–0.2)
nRBC: 0 % (ref 0.0–0.2)

## 2019-11-02 LAB — SARS CORONAVIRUS 2 (TAT 6-24 HRS): SARS Coronavirus 2: NEGATIVE

## 2019-11-02 LAB — PROTIME-INR
INR: 1.1 (ref 0.8–1.2)
Prothrombin Time: 14.3 seconds (ref 11.4–15.2)

## 2019-11-02 LAB — URINALYSIS, ROUTINE W REFLEX MICROSCOPIC
Bilirubin Urine: NEGATIVE
Glucose, UA: NEGATIVE mg/dL
Ketones, ur: NEGATIVE mg/dL
Leukocytes,Ua: NEGATIVE
Nitrite: NEGATIVE
Protein, ur: NEGATIVE mg/dL
Specific Gravity, Urine: 1.009 (ref 1.005–1.030)
pH: 6 (ref 5.0–8.0)

## 2019-11-02 LAB — URINE CULTURE

## 2019-11-02 LAB — LACTIC ACID, PLASMA
Lactic Acid, Venous: 1.1 mmol/L (ref 0.5–1.9)
Lactic Acid, Venous: 1.2 mmol/L (ref 0.5–1.9)

## 2019-11-02 LAB — APTT: aPTT: 30 seconds (ref 24–36)

## 2019-11-02 LAB — ECHOCARDIOGRAM COMPLETE
Height: 63 in
Weight: 1680 oz

## 2019-11-02 LAB — TSH: TSH: 0.784 u[IU]/mL (ref 0.350–4.500)

## 2019-11-02 LAB — BRAIN NATRIURETIC PEPTIDE: B Natriuretic Peptide: 123.9 pg/mL — ABNORMAL HIGH (ref 0.0–100.0)

## 2019-11-02 MED ORDER — AMLODIPINE BESYLATE 5 MG PO TABS
2.5000 mg | ORAL_TABLET | Freq: Every day | ORAL | Status: DC
  Administered 2019-11-03: 2.5 mg via ORAL
  Filled 2019-11-02: qty 1

## 2019-11-02 MED ORDER — CLOPIDOGREL BISULFATE 75 MG PO TABS
75.0000 mg | ORAL_TABLET | Freq: Every day | ORAL | Status: DC
  Administered 2019-11-03 – 2019-11-09 (×7): 75 mg via ORAL
  Filled 2019-11-02 (×7): qty 1

## 2019-11-02 MED ORDER — ONDANSETRON HCL 4 MG/2ML IJ SOLN: 4.0000 mg | Freq: Four times a day (QID) | INTRAMUSCULAR | Status: DC | PRN

## 2019-11-02 MED ORDER — MIRTAZAPINE 15 MG PO TABS
30.0000 mg | ORAL_TABLET | Freq: Every day | ORAL | Status: DC
  Administered 2019-11-02 – 2019-11-08 (×7): 30 mg via ORAL
  Filled 2019-11-02 (×6): qty 2
  Filled 2019-11-02: qty 1
  Filled 2019-11-02: qty 2

## 2019-11-02 MED ORDER — POLYETHYLENE GLYCOL 3350 17 G PO PACK: 17.0000 g | PACK | Freq: Every day | ORAL | Status: DC | PRN

## 2019-11-02 MED ORDER — TRAMADOL HCL 50 MG PO TABS
50.0000 mg | ORAL_TABLET | Freq: Four times a day (QID) | ORAL | Status: DC | PRN
  Administered 2019-11-02 – 2019-11-08 (×6): 50 mg via ORAL
  Filled 2019-11-02 (×7): qty 1

## 2019-11-02 MED ORDER — COLCHICINE 0.3 MG HALF TABLET
0.3000 mg | ORAL_TABLET | Freq: Every day | ORAL | Status: DC
  Administered 2019-11-03 – 2019-11-09 (×7): 0.3 mg via ORAL
  Filled 2019-11-02 (×8): qty 1

## 2019-11-02 MED ORDER — FAMOTIDINE 20 MG PO TABS
20.0000 mg | ORAL_TABLET | Freq: Two times a day (BID) | ORAL | Status: DC
  Administered 2019-11-02 – 2019-11-09 (×14): 20 mg via ORAL
  Filled 2019-11-02 (×15): qty 1

## 2019-11-02 MED ORDER — SODIUM CHLORIDE 0.9 % IV SOLN
1.0000 g | INTRAVENOUS | Status: AC
  Administered 2019-11-02 – 2019-11-05 (×4): 1 g via INTRAVENOUS
  Filled 2019-11-02 (×4): qty 1

## 2019-11-02 MED ORDER — GABAPENTIN 100 MG PO CAPS
100.0000 mg | ORAL_CAPSULE | Freq: Every day | ORAL | Status: DC
  Administered 2019-11-02 – 2019-11-08 (×7): 100 mg via ORAL
  Filled 2019-11-02 (×7): qty 1

## 2019-11-02 MED ORDER — ACETAMINOPHEN 325 MG PO TABS
650.0000 mg | ORAL_TABLET | Freq: Four times a day (QID) | ORAL | Status: DC | PRN
  Administered 2019-11-04 – 2019-11-08 (×3): 650 mg via ORAL
  Filled 2019-11-02 (×3): qty 2

## 2019-11-02 MED ORDER — DOCUSATE SODIUM 100 MG PO CAPS
100.0000 mg | ORAL_CAPSULE | Freq: Two times a day (BID) | ORAL | Status: DC
  Administered 2019-11-02 – 2019-11-09 (×13): 100 mg via ORAL
  Filled 2019-11-02 (×13): qty 1

## 2019-11-02 MED ORDER — SODIUM CHLORIDE 0.9 % IV SOLN: INTRAVENOUS | Status: AC

## 2019-11-02 MED ORDER — ATORVASTATIN CALCIUM 10 MG PO TABS
10.0000 mg | ORAL_TABLET | Freq: Every day | ORAL | Status: DC
  Administered 2019-11-03 – 2019-11-09 (×7): 10 mg via ORAL
  Filled 2019-11-02 (×6): qty 1

## 2019-11-02 MED ORDER — ONDANSETRON HCL 4 MG PO TABS: 4.0000 mg | ORAL_TABLET | Freq: Four times a day (QID) | ORAL | Status: DC | PRN

## 2019-11-02 MED ORDER — ENOXAPARIN SODIUM 30 MG/0.3ML ~~LOC~~ SOLN
30.0000 mg | Freq: Every day | SUBCUTANEOUS | Status: DC
  Administered 2019-11-02 – 2019-11-09 (×7): 30 mg via SUBCUTANEOUS
  Filled 2019-11-02 (×7): qty 0.3

## 2019-11-02 MED ORDER — LISINOPRIL 40 MG PO TABS
40.0000 mg | ORAL_TABLET | Freq: Every day | ORAL | Status: DC
  Administered 2019-11-03: 40 mg via ORAL
  Filled 2019-11-02: qty 1

## 2019-11-02 MED ORDER — ACETAMINOPHEN 650 MG RE SUPP: 650.0000 mg | Freq: Four times a day (QID) | RECTAL | Status: DC | PRN

## 2019-11-02 MED ORDER — MORPHINE SULFATE (PF) 2 MG/ML IV SOLN
1.0000 mg | INTRAVENOUS | Status: DC | PRN
  Administered 2019-11-04: 1 mg via INTRAVENOUS
  Filled 2019-11-02: qty 1

## 2019-11-02 NOTE — ED Notes (Signed)
MD here and has updated pt and husband.  No gross changes noted to POC at this time.  Will continue with work up as ordered.

## 2019-11-02 NOTE — Progress Notes (Signed)
Pt got on the table but with in 94mins said she had been in the scanner long enough. We had not even finished running scouts.

## 2019-11-02 NOTE — ED Notes (Signed)
Pt's husband would like to be updated in AM.   Jonah Blue [phone]

## 2019-11-02 NOTE — ED Notes (Signed)
Care handoff given to [name], [place]

## 2019-11-02 NOTE — H&P (Signed)
History and Physical    Kim Blevins GLO:[id number] DOB: 1935-06-10 DOA: 11/01/2019  PCP: Cathlean Sauer, MD  Patient coming from: Home   Chief Complaint:   Pneumonia  HPI:  85 year old female with past medical history of hypertension, gastroesophageal reflux disease, severe protein calorie malnutrition and hyperlipidemia who presents to [facility] emergency department due to persisting pneumonia.  Patient is currently an extremely poor historian due to lethargy thought to be secondary to underlying pneumonia.  Patient was recently admitted to [facility] in early April for a right middle lobe pneumonia.  Patient was treated with 3 days of intravenous ceftriaxone and azithromycin and discharged home.  Since that hospitalization, patient has been experiencing progressively worsening lethargy, "always sleeping" according to the patient's family.  Patient is also been experiencing worsening bilateral lower extremity swelling, poor oral intake and episodes of fever noted to be 101.8 F per PCP note.  With patient's worsening symptoms she presented to see her primary care provider on 4/14 where she was given a dose of intravenous ceftriaxone and prescribed a regimen of oral levofloxacin due to concerns for incompletely treated pneumonia.  However, the due to patient's progressive symptoms, patient was directed to go to [facility] emergency department for evaluation shortly thereafter.  Upon evaluation in the emergency department, chest x-ray reveals persisting pneumonia, now apparently in the right upper lobe with concurrent leukocytosis and tachycardia.  Patient was felt to be suffering from incompletely treated pneumonia with concurrent SIRS and therefore the hospitalist group was called to assess the patient for admission the hospital.   Review of Systems: Unable to perform due to substantial lethargy due to suspected metabolic encephalopathy.  Past  Medical History:  Diagnosis Date  . GERD (gastroesophageal reflux disease)   . Hypertension   . Lupus ([place])   . Stroke ([place])   . TIA (transient ischemic attack)     Past Surgical History:  Procedure Laterality Date  . APPENDECTOMY    . ESOPHAGOGASTRODUODENOSCOPY    . TEE WITHOUT CARDIOVERSION N/A 07/10/2013   Procedure: TRANSESOPHAGEAL ECHOCARDIOGRAM (TEE);  Surgeon: Thayer Headings, MD;  Location: [facility];  Service: Cardiovascular;  Laterality: N/A;     reports that she has never smoked. She has never used smokeless tobacco. She reports that she does not drink alcohol or use drugs.  Allergies  Allergen Reactions  . Plaquenil [Hydroxychloroquine Sulfate] Rash    History reviewed. No pertinent family history.   Prior to Admission medications   Medication Sig Start Date End Date Taking? Authorizing Provider  alendronate (FOSAMAX) 70 MG tablet Take 70 mg by mouth every Sunday. Take with a full glass of water on an empty stomach.   Yes [provider]  amLODipine (NORVASC) 2.5 MG tablet Take 2.5 mg by mouth daily.   Yes [provider]  ascorbic acid (VITAMIN C) 500 MG tablet Take 500 mg by mouth 2 (two) times daily. For 30 days   Yes [provider]  aspirin-acetaminophen-caffeine (EXCEDRIN MIGRAINE) [phone] MG tablet Take 1 tablet by mouth every 6 (six) hours as needed for headache.   Yes [provider]  atorvastatin (LIPITOR) 10 MG tablet Take 10 mg by mouth daily.   Yes [provider]  clopidogrel (PLAVIX) 75 MG tablet Take 75 mg by mouth daily with breakfast.   Yes [provider]  docusate sodium (COLACE) 100 MG capsule Take 100 mg by mouth 2 (two) times daily.   Yes [provider]  famotidine (PEPCID) 20 MG tablet Take 20 mg by mouth 2 (two) times daily.   Yes [provider]  gabapentin (NEURONTIN) 100 MG capsule Take 100 mg by mouth at bedtime.   Yes [provider]  lisinopril  (ZESTRIL) 40 MG tablet Take 40 mg by mouth daily.   Yes [provider]  mirtazapine (REMERON) 30 MG tablet Take 30 mg by mouth at bedtime.   Yes [provider]  Multiple Vitamins-Minerals (MULTIVITAMIN PO) Take 1 tablet by mouth daily.   Yes [provider]  Omega-3 Fatty Acids (FISH OIL PO) Take 1 capsule by mouth daily.   Yes [provider]  predniSONE (DELTASONE) 20 MG tablet Take 2 tablets by mouth daily. For 5 days 11/01/19 11/06/19 Yes [provider]  traMADol (ULTRAM) 50 MG tablet Take 50 mg by mouth every 6 (six) hours as needed for moderate pain. 10/16/19  Yes [provider]  zinc sulfate 220 (50 Zn) MG capsule Take 220 mg by mouth daily.   Yes [provider]  levETIRAcetam (KEPPRA) 500 MG tablet Take 1 tablet (500 mg total) by mouth 2 (two) times daily. Patient not taking: Reported on 11/01/2019 11/17/13   Sheila Oats, MD  zolpidem (AMBIEN) 10 MG tablet Take 0.5 tablets (5 mg total) by mouth at bedtime as needed for sleep. Patient not taking: Reported on 11/01/2019 07/09/13   Louellen Molder, MD    Physical Exam: Vitals:   11/02/19 0345 11/02/19 0400 11/02/19 0415 11/02/19 0430  BP: (!) 142/72 122/67 113/69 124/68  Pulse: 90 (!) 45 85 86  Resp: (!) 31 (!) '27 19 17  ' Temp:      TempSrc:      SpO2: 97% 95% 96% 97%  Weight:      Height:        Constitutional: Patient is extremely lethargic during my assessment but arousable and disoriented.  Patient is currently not in acute distress Skin: no rashes, no lesions, extremely poor skin turgor noted. Eyes: Pupils are equally reactive to light.  No evidence of scleral icterus or conjunctival pallor.  ENMT: Extremely dry mucous membranes noted. Posterior pharynx clear of any exudate or lesions. Normal dentition.   Neck: normal, supple, no masses, no thyromegaly Respiratory: Scattered rhonchi bilaterally with notable rales in the right lung fields.  No significant  wheezing noted.  Normal respiratory effort. No accessory muscle use.  Cardiovascular: Regular rate and rhythm, no murmurs / rubs / gallops.  Bilateral distal lower extremity edema. 2+ pedal pulses. No carotid bruits.  Back:   Nontender without crepitus or deformity. Abdomen: Abdomen is generally tender but soft.  No evidence of intra-abdominal masses.  Positive bowel sounds noted in all quadrants.   Musculoskeletal: No joint deformity upper and lower extremities. Good ROM, no contractures. Normal muscle tone.  Neurologic: Extremely lethargic and transiently arousable.  Patient is disoriented.  Patient is only intermittently following commands.  Patient does respond and localize to pain.   Psychiatric: Unable to fully assess due to lethargy.  Currently patient does not seem to possess insight as to her current situation.   Labs on Admission: I have personally reviewed following labs and imaging studies -   CBC: Recent Labs  Lab 11/01/19 2340  WBC 12.2*  NEUTROABS 10.6*  HGB 11.4*  HCT 36.9  MCV 95.6  PLT 159*   Basic Metabolic Panel: Recent Labs  Lab 11/01/19 2340  NA 135  K 4.2  CL 95*  CO2 28  GLUCOSE  148*  BUN 19  CREATININE 0.88  CALCIUM 8.9   GFR: Estimated Creatinine Clearance: 37 mL/min (by C-G formula based on SCr of 0.88 mg/dL). Liver Function Tests: Recent Labs  Lab 11/01/19 2340  AST 185*  ALT 87*  ALKPHOS 135*  BILITOT 0.8  PROT 6.5  ALBUMIN 2.3*   No results for input(s): LIPASE, AMYLASE in the last 168 hours. No results for input(s): AMMONIA in the last 168 hours. Coagulation Profile: Recent Labs  Lab 11/01/19 2340  INR 1.1   Cardiac Enzymes: No results for input(s): CKTOTAL, CKMB, CKMBINDEX, TROPONINI in the last 168 hours. BNP (last 3 results) No results for input(s): PROBNP in the last 8760 hours. HbA1C: No results for input(s): HGBA1C in the last 72 hours. CBG: No results for input(s): GLUCAP in the last 168 hours. Lipid Profile: No  results for input(s): CHOL, HDL, LDLCALC, TRIG, CHOLHDL, LDLDIRECT in the last 72 hours. Thyroid Function Tests: No results for input(s): TSH, T4TOTAL, FREET4, T3FREE, THYROIDAB in the last 72 hours. Anemia Panel: No results for input(s): VITAMINB12, FOLATE, FERRITIN, TIBC, IRON, RETICCTPCT in the last 72 hours. Urine analysis:    Component Value Date/Time   COLORURINE YELLOW 11/02/2019 0200   APPEARANCEUR CLEAR 11/02/2019 0200   LABSPEC 1.009 11/02/2019 0200   PHURINE 6.0 11/02/2019 0200   GLUCOSEU NEGATIVE 11/02/2019 0200   HGBUR SMALL (A) 11/02/2019 0200   BILIRUBINUR NEGATIVE 11/02/2019 0200   KETONESUR NEGATIVE 11/02/2019 0200   PROTEINUR NEGATIVE 11/02/2019 0200   UROBILINOGEN 0.2 11/15/2013 0152   NITRITE NEGATIVE 11/02/2019 0200   LEUKOCYTESUR NEGATIVE 11/02/2019 0200    Radiological Exams on Admission: DG Chest Port 1 View  Result Date: 11/01/2019 CLINICAL DATA:  Fever EXAM: PORTABLE CHEST 1 VIEW COMPARISON:  10/30/2019, CT chest 10/21/2019 FINDINGS: Partial consolidation in the right upper lobe remains, it now appears more dense and bandlike adjacent to the fissure. Left lung is clear. Normal heart size. No pneumothorax. Probable skin fold artifact over the left chest. IMPRESSION: Persistent partial consolidation in the right upper lobe which may be due to combination of atelectasis and pneumonia. Continued imaging follow-up to resolution is advised. Electronically Signed   By: Donavan Foil M.D.   On: 11/01/2019 23:39    EKG: Personally reviewed.  Rhythm is normal sinus rhythm with heart rate of 92 bpm.  Poor R wave progression in the precordial leads.  No dynamic ST segment changes appreciated.  Assessment/Plan Principal Problem:   Pneumonia of right upper lobe due to Streptococcus pneumoniae [facility])  Patient presenting with multiple sirs criteria including leukocytosis, fever and tachycardia in the setting of persisting right upper lobe pneumonia  Patient is likely  suffering from incompletely treated pneumonia after recent hospitalization at [facility] health  Patient been placed back on ceftriaxone and azithromycin by the emergency department staff which will continue at this time  Blood cultures have been obtained  Gentle intravenous hydration  Supplemental oxygen for bouts of hypoxia  As needed bronchodilator therapy for shortness of breath and wheezing  Patient seems to be suffering from a concurrent metabolic encephalopathy which will hopefully gradually improve as we treat underlying infection.  Active Problems:   SIRS (systemic inflammatory response syndrome) (HCC)   Please see assessment and plan notes above  acute metabolic encephalopathy   Patient presenting with poor appetite, lethargy and confusion according to family per my review of PCP note on 4/14  This is likely secondary to incompletely treated infection  Treating underlying infection and monitoring  for symptomatic improvement  Additionally obtaining TSH and vitamin B12    Common bile duct dilatation   Recent right upper quadrant ultrasound performed at [place] reveals common bile duct dilation with recent hepatic function panel revealing elevated transaminases and alkaline phosphatase  Patient was to undergo outpatient MRCP however considering recent fevers and encephalopathy we will go ahead and proceed with MRCP while hospitalized to ensure there is no evidence of cholangitis ( unlikely) or choledocholithiasis    Bilateral lower extremity edema   Patient and family have reported progressive bilateral lower extremity swelling in the past several weeks review of outpatient PCP notes  Clinically, patient does not appear to be in acute congestive heart failure  Urinalysis does not reveal substantial proteinuria  Hepatic function panel does reveal substantial hypoalbuminemia thought to be secondary to protein calorie malnutrition -this  can result in peripheral edema.  Obtaining echocardiogram to evaluate cardiac function    Severe protein-calorie malnutrition [facility])   Nutrition consultation  Courage oral intake    GERD without esophagitis   Continue outpatient regimen of famotidine   mixed hyperlipidemia   Continue home regimen of statin therapy  Essential hypertension   Continue home regimen of antihypertensive therapy     Code Status:  DNR confirmed on arrival during this hospitalization Family Communication: Family was briefed at the bedside earlier in the emergency department stay by ED staff Disposition Plan: Patient is anticipated to be discharged to home with home health services once patient has met maximum benefit from current hospitalization.   Consults called: None Admission status: Patient will be admitted to Observation and is anticipated to remain in the hospital for less than 2 midnights.   Vernelle Emerald MD [facility] Pager [phone]  If 7PM-7AM, please contact night-coverage [email or web address] Use universal [place] password for that web site. If you do not have the password, please call the hospital operator.  11/02/2019, 5:27 AM

## 2019-11-02 NOTE — ED Notes (Signed)
Pt taken to MRI  

## 2019-11-02 NOTE — ED Notes (Signed)
Pt awake and alert, with some confusion noted.  MD paged regarding testing.  Pt initially refused ECHO and is able to redirect.  MD to round again later, will update husband with pt when testing complete.

## 2019-11-02 NOTE — ED Notes (Signed)
This nurse received a call from MRI stating that pt was unable to sit for MRI r/t pain despite comfort measures. MD made aware.

## 2019-11-02 NOTE — ED Notes (Signed)
Lunch Tray Ordered@ 1025. 

## 2019-11-02 NOTE — Progress Notes (Signed)
  Echocardiogram 2D Echocardiogram has been performed.  Kim Blevins 11/02/2019, 9:48 AM

## 2019-11-02 NOTE — ED Notes (Signed)
Remains OTF

## 2019-11-02 NOTE — Progress Notes (Signed)
Patient seen and examined this morning, admitted overnight, H&P reviewed and agree with the assessment and plan.  85 year old female with HTN, GERD, protein calorie malnutrition, Alzheimer's dementia (early), was directed to the hospital by PCP.  This morning she is not sure as to why she is in the hospital however she does appear confused.  I have called and discussed with the husband.  He tells me that she was at [facility] from 4/2-4/5 with pneumonia, was treated with antibiotics and then released home.  He tells me that since she has been home she has been having progressive lethargy, sleepiness, as well as increased confusion.  He also reports that she has been having increasing left ankle swelling and pain over the last 4 days.  They saw the PCP yesterday and apparently she was febrile, and with leukocytosis and lethargy the PCP was concerned about an ongoing infectious process and she was directed to the emergency room.  Chest x-ray in the ED shows right upper lobe pneumonia for which she was admitted on IV antibiotics.  She has been having elevated LFTs with reported CBD dilation on ultrasound and an MRCP has been ordered to further evaluate.  Her BNP was elevated and a 2D echo has been ordered.  Left ankle x-ray has been ordered as well.  On my evaluation she is alert but remains confused, she appears quite weak, will obtain physical therapy to evaluate.  She does not look stable to go home until work-up is completed as appears quite far from her baseline in terms of her mentation and insight after discussing with the husband  Treylin Burtch M. Cruzita Lederer, MD, PhD [facility]  Between 7 am - 7 pm I am available, please contact me via Amion or Securechat Between 7 pm - 7 am I am not available, please contact night coverage MD/APP via Amion

## 2019-11-02 NOTE — ED Notes (Signed)
MRI called regarding test.  Pt does agree to test after her husband is called.

## 2019-11-02 NOTE — Progress Notes (Signed)
   11/02/19 1207  TOC ED Mini Assessment  TOC Time spent with patient (minutes): 15  TOC Time saved using PING (minutes): 15  PING Used in TOC Assessment Yes  Admission or Readmission Diverted No  What brought you to the Emergency Department?  confusion and weakness  Barriers to Discharge Continued Medical Work up  [employer] 1 Kim Blevins  Contact Date 11/02/19  Contact time 1209  Contact Phone Number [phone]  Patient states their goals for this hospitalization and ongoing recovery are: get his wife well   Active with [facility] for RN and PT per [facility].

## 2019-11-02 NOTE — ED Notes (Signed)
Breakfast ordered 

## 2019-11-03 ENCOUNTER — Other Ambulatory Visit: Payer: Self-pay

## 2019-11-03 ENCOUNTER — Inpatient Hospital Stay (HOSPITAL_COMMUNITY): Payer: Medicare Other

## 2019-11-03 DIAGNOSIS — R7989 Other specified abnormal findings of blood chemistry: Secondary | ICD-10-CM

## 2019-11-03 DIAGNOSIS — J189 Pneumonia, unspecified organism: Secondary | ICD-10-CM

## 2019-11-03 LAB — CBC WITH DIFFERENTIAL/PLATELET
Abs Immature Granulocytes: 0.06 10*3/uL (ref 0.00–0.07)
Basophils Absolute: 0.1 10*3/uL (ref 0.0–0.1)
Basophils Relative: 1 %
Eosinophils Absolute: 0.2 10*3/uL (ref 0.0–0.5)
Eosinophils Relative: 2 %
HCT: 38.1 % (ref 36.0–46.0)
Hemoglobin: 11.8 g/dL — ABNORMAL LOW (ref 12.0–15.0)
Immature Granulocytes: 1 %
Lymphocytes Relative: 20 %
Lymphs Abs: 1.9 10*3/uL (ref 0.7–4.0)
MCH: 29 pg (ref 26.0–34.0)
MCHC: 31 g/dL (ref 30.0–36.0)
MCV: 93.6 fL (ref 80.0–100.0)
Monocytes Absolute: 1.4 10*3/uL — ABNORMAL HIGH (ref 0.1–1.0)
Monocytes Relative: 15 %
Neutro Abs: 6.1 10*3/uL (ref 1.7–7.7)
Neutrophils Relative %: 61 %
Platelets: 442 10*3/uL — ABNORMAL HIGH (ref 150–400)
RBC: 4.07 MIL/uL (ref 3.87–5.11)
RDW: 15.1 % (ref 11.5–15.5)
WBC: 9.8 10*3/uL (ref 4.0–10.5)
nRBC: 0 % (ref 0.0–0.2)

## 2019-11-03 LAB — COMPREHENSIVE METABOLIC PANEL
ALT: 88 U/L — ABNORMAL HIGH (ref 0–44)
AST: 160 U/L — ABNORMAL HIGH (ref 15–41)
Albumin: 2.3 g/dL — ABNORMAL LOW (ref 3.5–5.0)
Alkaline Phosphatase: 122 U/L (ref 38–126)
Anion gap: 18 — ABNORMAL HIGH (ref 5–15)
BUN: 11 mg/dL (ref 8–23)
CO2: 24 mmol/L (ref 22–32)
Calcium: 8.8 mg/dL — ABNORMAL LOW (ref 8.9–10.3)
Chloride: 96 mmol/L — ABNORMAL LOW (ref 98–111)
Creatinine, Ser: 0.61 mg/dL (ref 0.44–1.00)
GFR calc Af Amer: >60 mL/min (ref >60)
GFR calc non Af Amer: >60 mL/min (ref >60)
Glucose, Bld: 96 mg/dL (ref 70–99)
Potassium: 5.3 mmol/L — ABNORMAL HIGH (ref 3.5–5.1)
Sodium: 138 mmol/L (ref 135–145)
Total Bilirubin: 2.4 mg/dL — ABNORMAL HIGH (ref 0.3–1.2)
Total Protein: 6 g/dL — ABNORMAL LOW (ref 6.5–8.1)

## 2019-11-03 LAB — VITAMIN B12: Vitamin B-12: 1036 pg/mL — ABNORMAL HIGH (ref 180–914)

## 2019-11-03 LAB — MAGNESIUM: Magnesium: 1.8 mg/dL (ref 1.7–2.4)

## 2019-11-03 MED ORDER — SODIUM ZIRCONIUM CYCLOSILICATE 5 G PO PACK
5.0000 g | PACK | Freq: Once | ORAL | Status: AC
  Administered 2019-11-03: 5 g via ORAL
  Filled 2019-11-03: qty 1

## 2019-11-03 MED ORDER — AMLODIPINE BESYLATE 10 MG PO TABS
10.0000 mg | ORAL_TABLET | Freq: Every day | ORAL | Status: DC
  Administered 2019-11-04 – 2019-11-09 (×6): 10 mg via ORAL
  Filled 2019-11-03 (×6): qty 1

## 2019-11-03 NOTE — Evaluation (Signed)
Physical Therapy Evaluation Patient Details Name: Kim Blevins MRN: [id number] DOB: Jan 25, 1935 Today's Date: 11/03/2019   History of Present Illness  85 year old female with PMH of hypertension, GERD, Lupus, stroke, severe protein calorie malnutrition and hyperlipidemia who presents to the ED from PCP due to persisting pneumonia.    Clinical Impression  Patient presented sitting in bed, awake, and willing to participate in therapy. PTA, pt was ambulating with cane and required inc assistance (this past week) with ADL's. Pt lives with her husband in a 2 story home but is able to reside on main floor. At the time of evaluation, pt required minA +1 to stand from EOB but immediately reached for second person assist to steady oneself. Pt required 2 HHA for safe ambulation initially, then min guard of one when ambulating with RW. 2 instances of posterior LOB, requiring minA to steady. Overall, pt demosntrated mild unsteadiness throughout. Recommend d/c home with [facility] PT to maximize safety, function, and independence. Pt would continue to benefit from skilled physical therapy services at this time while admitted and after d/c to address the below listed limitations in order to improve overall safety and independence with functional mobility.     Follow Up Recommendations Home health PT;Supervision/Assistance - 24 hour    Equipment Recommendations  Other (comment)(pt has cane, RW, and BSC)    Recommendations for Other Services       Precautions / Restrictions Precautions Precautions: Fall Restrictions Weight Bearing Restrictions: No      Mobility  Bed Mobility Overal bed mobility: Needs Assistance Bed Mobility: Supine to Sit     Supine to sit: Min assist     General bed mobility comments: assistance needed for trunk elevation, HOB elevated, use of bed rails  Transfers Overall transfer level: Needs assistance Equipment used: 1 person hand held assist;2 person hand held  assist Transfers: Sit to/from Stand Sit to Stand: Min assist         General transfer comment: Pt able to stand with 1 HHA but reaches for second HHA for steadying and ambulation  Ambulation/Gait Ambulation/Gait assistance: Min assist;+2 physical assistance Gait Distance (Feet): 15 Feet Assistive device: 2 person hand held assist;Rolling walker (2 wheeled) Gait Pattern/deviations: Step-through pattern;Decreased stride length;Trunk flexed Gait velocity: markedly dec   General Gait Details: Pt requires B UE support for safe ambulation. Occasional near-LOB noted with HHA and RW  Stairs            Wheelchair Mobility    Modified Rankin (Stroke Patients Only)       Balance Overall balance assessment: Needs assistance Sitting-balance support: Bilateral upper extremity supported;Feet supported Sitting balance-Leahy Scale: Fair     Standing balance support: Bilateral upper extremity supported;During functional activity;Single extremity supported Standing balance-Leahy Scale: Poor Standing balance comment: Pt requires UE support to maintain balance. 2 instances (both backwards) of LOB requiring minA to steady                             Pertinent Vitals/Pain Pain Assessment: Faces Faces Pain Scale: No hurt    Home Living Family/patient expects to be discharged to:: Private residence Living Arrangements: Spouse/significant other Available Help at Discharge: Family;Available 24 hours/day Type of Home: House Home Access: Stairs to enter Entrance Stairs-Rails: Right;Left Entrance Stairs-Number of Steps: 4 Home Layout: Full bath on main level;Able to live on main level with bedroom/bathroom;Two level Home Equipment: Walker - 2 wheels;Cane - single point;Wheelchair - manual;Shower seat;Grab bars -  tub/shower;Bedside commode      Prior Function Level of Independence: Independent with assistive device(s)         Comments: ambulates with a cane; husband does  the driving, cooking and she pays a private [place]; wears glasses     Hand Dominance        Extremity/Trunk Assessment   Upper Extremity Assessment Upper Extremity Assessment: Defer to OT evaluation;Generalized weakness    Lower Extremity Assessment Lower Extremity Assessment: Generalized weakness    Cervical / Trunk Assessment Cervical / Trunk Assessment: Kyphotic  Communication   Communication: No difficulties  Cognition Arousal/Alertness: Awake/alert Behavior During Therapy: WFL for tasks assessed/performed Overall Cognitive Status: Impaired/Different from baseline Area of Impairment: Safety/judgement;Memory;Awareness                     Memory: Decreased short-term memory   Safety/Judgement: Decreased awareness of safety;Decreased awareness of deficits Awareness: Emergent   General Comments: requires inc time to respond to questions. Impaired short-term memory requiring frequent reminders about tasks. Unable to recall why she did not recieve MRI yesterday      General Comments General comments (skin integrity, edema, etc.): HR in 90's resting but RN notified from tele for HR in 160's during mobility.     Exercises     Assessment/Plan    PT Assessment Patient needs continued PT services  PT Problem List Decreased strength;Decreased activity tolerance;Decreased balance;Decreased mobility;Decreased coordination;Decreased cognition;Decreased knowledge of use of DME;Decreased safety awareness       PT Treatment Interventions DME instruction;Gait training;Stair training;Therapeutic activities;Functional mobility training;Therapeutic exercise;Balance training;Neuromuscular re-education;Cognitive remediation;Patient/family education    PT Goals (Current goals can be found in the Care Plan section)  Acute Rehab PT Goals Patient Stated Goal: to go home PT Goal Formulation: With patient/family Time For Goal Achievement: 11/17/19 Potential to Achieve  Goals: Fair    Frequency Min 3X/week   Barriers to discharge        Co-evaluation PT/OT/SLP Co-Evaluation/Treatment: Yes Reason for Co-Treatment: For patient/therapist safety;To address functional/ADL transfers PT goals addressed during session: Mobility/safety with mobility;Balance;Proper use of DME         AM-PAC PT "6 Clicks" Mobility  Outcome Measure Help needed turning from your back to your side while in a flat bed without using bedrails?: A Little Help needed moving from lying on your back to sitting on the side of a flat bed without using bedrails?: None Help needed moving to and from a bed to a chair (including a wheelchair)?: A Little Help needed standing up from a chair using your arms (e.g., wheelchair or bedside chair)?: A Little Help needed to walk in hospital room?: A Little Help needed climbing 3-5 steps with a railing? : A Lot 6 Click Score: 18    End of Session   Activity Tolerance: Patient tolerated treatment well Patient left: in chair;with call bell/phone within reach;with chair alarm set;with family/visitor present Nurse Communication: Mobility status PT Visit Diagnosis: Unsteadiness on feet (R26.81);Other abnormalities of gait and mobility (R26.89)    Time: [id number] PT Time Calculation (min) (ACUTE ONLY): 22 min   Charges:   PT Evaluation $PT Eval Moderate Complexity: 1 Mod        Glora Hulgan, SPT Acute Rehab  [id number]   Tory Septer 11/03/2019, 11:25 AM

## 2019-11-03 NOTE — Progress Notes (Signed)
PROGRESS NOTE  Kim Blevins IEP:[id number] DOB: [birth year] DOA: 11/01/2019 PCP: Cathlean Sauer, MD   LOS: 1 day   Brief Narrative / Interim history: 85 year old female with HTN, GERD, protein calorie malnutrition, probable Alzheimer's dementia, was directed to the hospital by PCP. She was hospitalized at [facility] from 4/2-4/5 with pneumonia, was treated with antibiotics and then released home.  Husband tells me that since she has been home she has been having progressive lethargy, sleepiness, as well as increased confusion.  He also reports that she has been having increasing left ankle swelling and pain over the last 4 days.  They saw the PCP the day prior to admission and apparently she was febrile, and with leukocytosis and lethargy the PCP was concerned about an ongoing infectious process and she was directed to the emergency room.  She also has elevated LFTs and CBD dilation on ultrasound and an MRCP has been ordered as an outpatient.  Subjective / 24h Interval events: Just finished her breakfast this morning, she is doing well, mildly confused but has no complaints.  Assessment & Plan: Principal Problem Sepsis due to lobar pneumonia-patient met criteria with tachycardia, tachypnea as well as elevated WBC, she was started on ceftriaxone and azithromycin and seems to be improving.  Her white count has now normalized, she is afebrile and her respiratory status has improved.  Continue antibiotics  Active Problems Elevated LFTs with CBD dilation-patient does not have any GI symptoms today however she has poor recollection whether she had GI symptoms at home.  Her AST, ALT are elevated but stable, however total bilirubin increased from 0.8 on admission to 2.4 today.  Agree with MRCP, have reordered today and hopefully she will tolerate.  Left ankle swelling-improved today, started on colchicine, suspect gout.  X-ray without acute findings.  Her BNP was elevated on admission underwent a 2D echo  which was essentially unremarkable with normal ejection fraction.  Essential hypertension-continue amlodipine, lisinopril, blood pressure overall acceptable in the 660 systolic this morning  Hyperlipidemia-continue statin  Irregular heart rate-this is sinus arrhythmia, no evidence of A. fib currently  Severe protein calorie malnutrition-chronic, dietary consult  History of TIA-she is on Plavix, continue  Hyperkalemia-mild, potassium 5.3 this morning, give a dose of Lokelma and hold lisinopril for now, increase Norvasc to compensate   Scheduled Meds: . amLODipine  2.5 mg Oral Daily  . atorvastatin  10 mg Oral Daily  . clopidogrel  75 mg Oral Q breakfast  . colchicine  0.3 mg Oral Daily  . docusate sodium  100 mg Oral BID  . enoxaparin (LOVENOX) injection  30 mg Subcutaneous Daily  . famotidine  20 mg Oral BID  . gabapentin  100 mg Oral QHS  . lisinopril  40 mg Oral Daily  . mirtazapine  30 mg Oral QHS  . sodium zirconium cyclosilicate  5 g Oral Once   Continuous Infusions: . azithromycin 500 mg (11/02/19 2346)  . cefTRIAXone (ROCEPHIN)  IV 1 g (11/02/19 2250)   PRN Meds:.acetaminophen **OR** acetaminophen, morphine injection, ondansetron **OR** ondansetron (ZOFRAN) IV, polyethylene glycol, traMADol  DVT prophylaxis: Lovenox Code Status: DNR Family Communication: Updated husband over the phone on 4/15, will call again today this afternoon Patient admitted from: Home Anticipated d/c place: To be determined, physical therapy evaluation pending Barriers to d/c: LFTs continue to increase, MRI/MRCP pending, physical therapy evaluation pending  Consultants:  None  Procedures:  2D echo:  IMPRESSIONS  1. Left ventricular ejection fraction, by estimation, is 60 to 65%.  The left ventricle has normal function. The left ventricle has no regional wall motion abnormalities. Left ventricular diastolic function could not be evaluated.  2. Right ventricular systolic function is normal.  The right ventricular size is normal.  3. The mitral valve is normal in structure. Trivial mitral valve regurgitation. No evidence of mitral stenosis.  4. The aortic valve is normal in structure. Aortic valve regurgitation is not visualized. No aortic stenosis is present.  5. The inferior vena cava is normal in size with greater than 50% respiratory variability, suggesting right atrial pressure of 3 mmHg.   Microbiology  None   Antimicrobials: Ceftriaxone / Azithromycin 4/15 >>    Objective: Vitals:   11/03/19 0045 11/03/19 0517 11/03/19 0859 11/03/19 0900  BP: (!) 167/88 (!) 179/82 (!) 158/72 (!) 151/75  Pulse: 92 86  82  Resp: '20 16  18  ' Temp: 97.7 F (36.5 C) 98 F (36.7 C)  97.8 F (36.6 C)  TempSrc: Oral Oral  Oral  SpO2: 96% 96%  98%  Weight:      Height:        Intake/Output Summary (Last 24 hours) at 11/03/2019 1024 Last data filed at 11/03/2019 0900 Gross per 24 hour  Intake 1042.33 ml  Output 800 ml  Net 242.33 ml   Filed Weights   11/01/19 2310  Weight: 47.6 kg    Examination:  Constitutional: NAD, cachectic appearing Eyes: no scleral icterus ENMT: Mucous membranes are moist.  Neck: normal, supple Respiratory: clear to auscultation bilaterally, no wheezing, no crackles. Normal respiratory effort.  Overall distant breath sounds Cardiovascular: Regular rate and rhythm, no murmurs / rubs / gallops. No LE edema. Good peripheral pulses Abdomen: non distended, no tenderness. Bowel sounds positive.  Musculoskeletal: no clubbing / cyanosis.  Skin: no rashes Neurologic: No focal deficits  Data Reviewed: I have independently reviewed following labs and imaging studies   CBC: Recent Labs  Lab 11/01/19 2340 11/02/19 0530 11/03/19 0644  WBC 12.2* 11.0* 9.8  NEUTROABS 10.6* 8.2* 6.1  HGB 11.4* 10.6* 11.8*  HCT 36.9 34.0* 38.1  MCV 95.6 95.8 93.6  PLT 444* 399 199*   Basic Metabolic Panel: Recent Labs  Lab 11/01/19 2340 11/02/19 0530 11/03/19 0644   NA 135 138 138  K 4.2 3.9 5.3*  CL 95* 99 96*  CO2 '28 26 24  ' GLUCOSE 148* 147* 96  BUN '19 16 11  ' CREATININE 0.88 0.70 0.61  CALCIUM 8.9 8.8* 8.8*  MG  --   --  1.8  PHOS  --  2.8  --    Liver Function Tests: Recent Labs  Lab 11/01/19 2340 11/02/19 0530 11/03/19 0644  AST 185*  --  160*  ALT 87*  --  88*  ALKPHOS 135*  --  122  BILITOT 0.8  --  2.4*  PROT 6.5  --  6.0*  ALBUMIN 2.3* 2.1* 2.3*   Coagulation Profile: Recent Labs  Lab 11/01/19 2340  INR 1.1   HbA1C: No results for input(s): HGBA1C in the last 72 hours. CBG: No results for input(s): GLUCAP in the last 168 hours.  Recent Results (from the past 240 hour(s))  Blood Culture (routine x 2)     Status: None (Preliminary result)   Collection Time: 11/01/19 11:40 PM   Specimen: BLOOD  Result Value Ref Range Status   Specimen Description BLOOD SITE NOT SPECIFIED  Final   Special Requests   Final    BOTTLES DRAWN AEROBIC AND ANAEROBIC Blood Culture results may  not be optimal due to an inadequate volume of blood received in culture bottles   Culture   Final    NO GROWTH < 24 HOURS Performed at [facility] [address], [place], [place] [address]    Report Status PENDING  Incomplete  Blood Culture (routine x 2)     Status: None (Preliminary result)   Collection Time: 11/01/19 11:48 PM   Specimen: BLOOD  Result Value Ref Range Status   Specimen Description BLOOD SITE NOT SPECIFIED  Final   Special Requests   Final    BOTTLES DRAWN AEROBIC AND ANAEROBIC Blood Culture adequate volume   Culture   Final    NO GROWTH < 24 HOURS Performed at [facility], [place] [address], [place], [place] [address]    Report Status PENDING  Incomplete  SARS CORONAVIRUS 2 (TAT 6-24 HRS) Nasopharyngeal Nasopharyngeal Swab     Status: None   Collection Time: 11/02/19 12:52 AM   Specimen: Nasopharyngeal Swab  Result Value Ref Range Status   SARS Coronavirus 2 NEGATIVE NEGATIVE Final    Comment: (NOTE) SARS-CoV-2  target nucleic acids are NOT DETECTED. The SARS-CoV-2 RNA is generally detectable in upper and lower respiratory specimens during the acute phase of infection. Negative results do not preclude SARS-CoV-2 infection, do not rule out co-infections with other pathogens, and should not be used as the sole basis for treatment or other patient management decisions. Negative results must be combined with clinical observations, patient history, and epidemiological information. The expected result is Negative. Fact Sheet for Patients: SugarRoll.be Fact Sheet for Healthcare Providers: https://www.woods-mathews.com/ This test is not yet approved or cleared by the [place] FDA and  has been authorized for detection and/or diagnosis of SARS-CoV-2 by FDA under an Emergency Use Authorization (EUA). This EUA will remain  in effect (meaning this test can be used) for the duration of the COVID-19 declaration under Section 56 4(b)(1) of the Act, 21 U.S.C. section 360bbb-3(b)(1), unless the authorization is terminated or revoked sooner. Performed at [facility], [place] [address], [place], [place] [address]   Urine culture     Status: Abnormal   Collection Time: 11/02/19  2:00 AM   Specimen: In/Out Cath Urine  Result Value Ref Range Status   Specimen Description IN/OUT CATH URINE  Final   Special Requests   Final    NONE Performed at [facility], [place] [address], [place], [place] [address]    Culture MULTIPLE SPECIES PRESENT, SUGGEST RECOLLECTION (A)  Final   Report Status 11/02/2019 FINAL  Final     Radiology Studies: DG Ankle 2 Views Left  Result Date: 11/02/2019 CLINICAL DATA:  Swelling, left ankle swelling for 4 days. EXAM: LEFT ANKLE - 2 VIEW COMPARISON:  11/01/2019 FINDINGS: As compared to the study from the 14th of April there is persistent swelling about the ankle overlying the lateral malleolus. Osteopenia. No signs of fracture or  dislocation. No bony erosion. Added density along the anterior joint may represent small effusion. IMPRESSION: Persistent soft tissue swelling about the ankle overlying the lateral malleolus. No underlying bony abnormality. Possible small effusion. Findings may be related to soft tissue injury, could also be associated with inflammatory arthropathy. Correlate with any clinical signs of infection. Electronically Signed   By: Zetta Bills M.D.   On: 11/02/2019 10:56    Marzetta Board, MD, PhD [facility]  Between 7 am - 7 pm I am available, please contact me via [place]  or Securechat  Between 7 pm - 7 am I am not available, please contact night coverage MD/APP via Amion

## 2019-11-03 NOTE — Progress Notes (Signed)
Kim Ion, MD patient [phone] potassium 5.3

## 2019-11-03 NOTE — Progress Notes (Signed)
New Admission Note: ? Arrival Method: via stretcher Mental Orientation: A/O x 4  Telemetry: Box # 14 NSR  Assessment: Completed Skin: Refer to flowsheet IV: Left hand and Right FA Pain: 8/10 Tubes: Safety Measures: Safety Fall Prevention Plan discussed with patient. Admission: Completed [facility] [place] Orientation: Patient has been orientated to the room, unit and the staff.  Orders have been reviewed and are being implemented. Will continue to monitor the patient. Call light has been placed within reach and bed alarm has been activated.  ? [employer], [place]

## 2019-11-03 NOTE — Plan of Care (Signed)
  Problem: Education: Goal: Knowledge of General Education information will improve Description: Including pain rating scale, medication(s)/side effects and non-pharmacologic comfort measures Outcome: Progressing   Problem: Activity: Goal: Risk for activity intolerance will decrease Outcome: Progressing   Problem: Nutrition: Goal: Adequate nutrition will be maintained Outcome: Progressing   Problem: Coping: Goal: Level of anxiety will decrease Outcome: Progressing   

## 2019-11-03 NOTE — Plan of Care (Signed)
  Problem: Education: Goal: Knowledge of General Education information will improve Description Including pain rating scale, medication(s)/side effects and non-pharmacologic comfort measures Outcome: Progressing   

## 2019-11-03 NOTE — Evaluation (Signed)
Occupational Therapy Evaluation Patient Details Name: Kim Blevins MRN: [id number] DOB: 05/15/1935 Today's Date: 11/03/2019    History of Present Illness 85 year old female presenting to ED by PCP due to lethargy, confusion, and persistent pneumonia. PMH of HTN, GERD, Lupus, stroke, probable Alzheimer's dementia severe protein calorie malnutrition and hyperlipidemia.   Clinical Impression   PTA, pt was living with her husband and was performing BADLs and requiring SPC for mobility; husband reports that the last two weeks she has required assistance for BADLs and using w/c. Pt currently requiring Min A for ADLs and functional mobility with use of RW. Pt presenting with decreased balance and presenting with several episodes of LOB requiring Min A for correction. Pt would benefit from further acute OT to facilitate safe dc. Recommend dc to home with HHOT for further OT to optimize safety, independence with ADLs, and return to PLOF.     Follow Up Recommendations  Home health OT;Supervision/Assistance - 24 hour    Equipment Recommendations  None recommended by OT    Recommendations for Other Services PT consult     Precautions / Restrictions Precautions Precautions: Fall Restrictions Weight Bearing Restrictions: No      Mobility Bed Mobility Overal bed mobility: Needs Assistance Bed Mobility: Supine to Sit     Supine to sit: Min assist     General bed mobility comments: assistance needed for trunk elevation, HOB elevated, use of bed rails  Transfers Overall transfer level: Needs assistance Equipment used: 1 person hand held assist;2 person hand held assist Transfers: Sit to/from Stand Sit to Stand: Min assist         General transfer comment: Pt able to stand with 1 HHA but reaches for second HHA for steadying and ambulation    Balance Overall balance assessment: Needs assistance Sitting-balance support: Bilateral upper extremity supported;Feet supported Sitting  balance-Leahy Scale: Fair     Standing balance support: Bilateral upper extremity supported;During functional activity;Single extremity supported Standing balance-Leahy Scale: Poor Standing balance comment: Pt requires UE support to maintain balance. 2 instances (both backwards) of LOB requiring minA to steady                           ADL either performed or assessed with clinical judgement   ADL Overall ADL's : Needs assistance/impaired Eating/Feeding: Set up;Sitting;Supervision/ safety   Grooming: Min guard;Minimal assistance;Wash/dry face;Oral care;Brushing hair   Upper Body Bathing: Min guard;Sitting   Lower Body Bathing: Minimal assistance;Sit to/from stand   Upper Body Dressing : Minimal assistance;Sitting Upper Body Dressing Details (indicate cue type and reason): Min A for donning sweater Lower Body Dressing: Minimal assistance;Sit to/from stand   Toilet Transfer: Minimal assistance;Min guard;Ambulation;RW(simulated to recliner)           Functional mobility during ADLs: Min guard;Rolling walker;Minimal assistance General ADL Comments: Pt presenting with decreased balance and strength. Requiring Mi nGuard-Min A throughout for safety and fall prevention     Vision Baseline Vision/History: Wears glasses Wears Glasses: At all times Patient Visual Report: No change from baseline       Perception     Praxis      Pertinent Vitals/Pain Pain Assessment: Faces Faces Pain Scale: No hurt Pain Intervention(s): Monitored during session     Hand Dominance Right   Extremity/Trunk Assessment Upper Extremity Assessment Upper Extremity Assessment: Generalized weakness   Lower Extremity Assessment Lower Extremity Assessment: Defer to PT evaluation   Cervical / Trunk Assessment Cervical / Trunk Assessment:  Kyphotic   Communication Communication Communication: No difficulties   Cognition Arousal/Alertness: Awake/alert Behavior During Therapy: WFL for  tasks assessed/performed Overall Cognitive Status: History of cognitive impairments - at baseline Area of Impairment: Safety/judgement;Memory;Awareness                     Memory: Decreased short-term memory   Safety/Judgement: Decreased awareness of safety;Decreased awareness of deficits Awareness: Emergent   General Comments: requires inc time to respond to questions. Impaired short-term memory requiring frequent reminders about tasks. Unable to recall why she did not recieve MRI yesterday   General Comments  HR in 90's resting but RN notified from tele for HR in 160's during mobility.     Exercises     Shoulder Instructions      Home Living Family/patient expects to be discharged to:: Private residence Living Arrangements: Spouse/significant other Available Help at Discharge: Family;Available 24 hours/day Type of Home: House Home Access: Stairs to enter [employer] of Steps: 4 Entrance Stairs-Rails: Right;Left Home Layout: Full bath on main level;Able to live on main level with bedroom/bathroom;Two level     Bathroom Shower/Tub: Occupational psychologist: Standard(BSC over toilet per husband)     Home Equipment: Environmental consultant - 2 wheels;Cane - single point;Wheelchair - manual;Shower seat;Grab bars - tub/shower;Bedside commode          Prior Functioning/Environment Level of Independence: Independent with assistive device(s)        Comments: Uses SPC for mobility. Performs BADLs. Husband does IADLs including cook and driving. Pays for cleaners.         OT Problem List: Decreased strength;Decreased range of motion;Decreased activity tolerance;Impaired balance (sitting and/or standing);Decreased safety awareness;Decreased knowledge of use of DME or AE;Decreased knowledge of precautions      OT Treatment/Interventions: Self-care/ADL training;Therapeutic exercise;Energy conservation;DME and/or AE instruction;Therapeutic activities;Patient/family  education    OT Goals(Current goals can be found in the care plan section) Acute Rehab OT Goals Patient Stated Goal: to go home OT Goal Formulation: With patient/family Time For Goal Achievement: 11/17/19 Potential to Achieve Goals: Good  OT Frequency: Min 2X/week   Barriers to D/C:            Co-evaluation PT/OT/SLP Co-Evaluation/Treatment: Yes Reason for Co-Treatment: For patient/therapist safety;To address functional/ADL transfers PT goals addressed during session: Mobility/safety with mobility;Balance;Proper use of DME OT goals addressed during session: ADL's and self-care      AM-PAC OT "6 Clicks" Daily Activity     Outcome Measure Help from another person eating meals?: A Little Help from another person taking care of personal grooming?: A Little Help from another person toileting, which includes using toliet, bedpan, or urinal?: A Little Help from another person bathing (including washing, rinsing, drying)?: A Little Help from another person to put on and taking off regular upper body clothing?: A Little Help from another person to put on and taking off regular lower body clothing?: A Little 6 Click Score: 18   End of Session Equipment Utilized During Treatment: Rolling walker Nurse Communication: Mobility status  Activity Tolerance: Patient tolerated treatment well Patient left: in chair;with call bell/phone within reach;with chair alarm set;with family/visitor present  OT Visit Diagnosis: Unsteadiness on feet (R26.81);Other abnormalities of gait and mobility (R26.89);Muscle weakness (generalized) (M62.81);Pain                Time: [id number] OT Time Calculation (min): 22 min Charges:  OT General Charges $OT Visit: 1 Visit OT Evaluation $OT Eval Moderate Complexity: 1 Mod  [place], OTR/L Acute Rehab Pager: [phone] Office: [place] 11/03/2019, 11:38 AM

## 2019-11-03 NOTE — Progress Notes (Signed)
PT Progress Note for Charges    11/03/19 1100  PT Visit Information  Last PT Received On 11/03/19  PT General Charges  $$ ACUTE PT VISIT 1 Visit  PT Evaluation  $PT Eval Moderate Complexity 1 Mod  Brittaney Beaulieu A, PT, DPT  Acute Rehabilitation Services Pager [phone] Office [phone]   

## 2019-11-04 LAB — COMPREHENSIVE METABOLIC PANEL
ALT: 79 U/L — ABNORMAL HIGH (ref 0–44)
AST: 98 U/L — ABNORMAL HIGH (ref 15–41)
Albumin: 2.4 g/dL — ABNORMAL LOW (ref 3.5–5.0)
Alkaline Phosphatase: 111 U/L (ref 38–126)
Anion gap: 15 (ref 5–15)
BUN: 12 mg/dL (ref 8–23)
CO2: 27 mmol/L (ref 22–32)
Calcium: 8.9 mg/dL (ref 8.9–10.3)
Chloride: 97 mmol/L — ABNORMAL LOW (ref 98–111)
Creatinine, Ser: 0.6 mg/dL (ref 0.44–1.00)
GFR calc Af Amer: >60 mL/min (ref >60)
GFR calc non Af Amer: >60 mL/min (ref >60)
Glucose, Bld: 111 mg/dL — ABNORMAL HIGH (ref 70–99)
Potassium: 3 mmol/L — ABNORMAL LOW (ref 3.5–5.1)
Sodium: 139 mmol/L (ref 135–145)
Total Bilirubin: 0.5 mg/dL (ref 0.3–1.2)
Total Protein: 6 g/dL — ABNORMAL LOW (ref 6.5–8.1)

## 2019-11-04 LAB — CBC
HCT: 35.8 % — ABNORMAL LOW (ref 36.0–46.0)
Hemoglobin: 11.3 g/dL — ABNORMAL LOW (ref 12.0–15.0)
MCH: 29.3 pg (ref 26.0–34.0)
MCHC: 31.6 g/dL (ref 30.0–36.0)
MCV: 92.7 fL (ref 80.0–100.0)
Platelets: 484 10*3/uL — ABNORMAL HIGH (ref 150–400)
RBC: 3.86 MIL/uL — ABNORMAL LOW (ref 3.87–5.11)
RDW: 14.9 % (ref 11.5–15.5)
WBC: 8.3 10*3/uL (ref 4.0–10.5)
nRBC: 0 % (ref 0.0–0.2)

## 2019-11-04 MED ORDER — POTASSIUM CHLORIDE CRYS ER 20 MEQ PO TBCR
30.0000 meq | EXTENDED_RELEASE_TABLET | Freq: Once | ORAL | Status: AC
  Administered 2019-11-04: 30 meq via ORAL
  Filled 2019-11-04: qty 1

## 2019-11-04 NOTE — Consult Note (Addendum)
Unassigned Consult for [facility].  Reason for Consult: Choledocholithiasis and elevated liver enzymes Referring Physician: [facility]  Denton Meek HPI:  This is an 85 year old female with a PMH of GERD, HTN, hyperlipidemia, and possible persistent pneumonia.  She was recently treated at [facility] for a RML pneumonia.  After her 3 day hospitalization he was reported by family that she was somonlent and she spiked fevers to 101.8 F.  Her oral intake was also poor.  AS a result she presented to [facility] for further evaluation and treatment.  While at [facility] she was noted to have an elevation in her liver enzymes and this was confirmed with this admisnsion.  Prior to April her liver enzymes were normal.  An ultrasound at [facility] was suggestive of a dilated CBD as it was measured to be 8 mm.  An MRCP was performed yesterday and the CBD was noted to be 5 mm, but there is a filling defect consistent with a stone. It was non-obstructive.  The MRCP was performed as her TB increased.  With antibiotics her WBC is normal and she is hemodynamically stable.  Past Medical History:  Diagnosis Date  . GERD (gastroesophageal reflux disease)   . Hypertension   . Lupus ([place])   . Stroke ([place])   . TIA (transient ischemic attack)     Past Surgical History:  Procedure Laterality Date  . APPENDECTOMY    . ESOPHAGOGASTRODUODENOSCOPY    . TEE WITHOUT CARDIOVERSION N/A 07/10/2013   Procedure: TRANSESOPHAGEAL ECHOCARDIOGRAM (TEE);  Surgeon: Thayer Headings, MD;  Location: [place];  Service: Cardiovascular;  Laterality: N/A;    History reviewed. No pertinent family history.  Social History:  reports that she has never smoked. She has never used smokeless tobacco. She reports that she does not drink alcohol or use drugs.  Allergies:  Allergies  Allergen Reactions  . Plaquenil [Hydroxychloroquine Sulfate] Rash    Medications:  Scheduled: . amLODipine  10 mg Oral Daily  .  atorvastatin  10 mg Oral Daily  . clopidogrel  75 mg Oral Q breakfast  . colchicine  0.3 mg Oral Daily  . docusate sodium  100 mg Oral BID  . enoxaparin (LOVENOX) injection  30 mg Subcutaneous Daily  . famotidine  20 mg Oral BID  . gabapentin  100 mg Oral QHS  . mirtazapine  30 mg Oral QHS   Continuous: . azithromycin 500 mg (11/04/19 0014)  . cefTRIAXone (ROCEPHIN)  IV 1 g (11/03/19 2256)    Results for orders placed or performed during the hospital encounter of 11/01/19 (from the past 24 hour(s))  CBC     Status: Abnormal   Collection Time: 11/04/19  7:16 AM  Result Value Ref Range   WBC 8.3 4.0 - 10.5 K/uL   RBC 3.86 (L) 3.87 - 5.11 MIL/uL   Hemoglobin 11.3 (L) 12.0 - 15.0 g/dL   HCT 35.8 (L) 36.0 - 46.0 %   MCV 92.7 80.0 - 100.0 fL   MCH 29.3 26.0 - 34.0 pg   MCHC 31.6 30.0 - 36.0 g/dL   RDW 14.9 11.5 - 15.5 %   Platelets 484 (H) 150 - 400 K/uL   nRBC 0.0 0.0 - 0.2 %     DG Ankle 2 Views Left  Result Date: 11/02/2019 CLINICAL DATA:  Swelling, left ankle swelling for 4 days. EXAM: LEFT ANKLE - 2 VIEW COMPARISON:  11/01/2019 FINDINGS: As compared to the study from the 14th of  April there is persistent swelling about the ankle overlying the lateral malleolus. Osteopenia. No signs of fracture or dislocation. No bony erosion. Added density along the anterior joint may represent small effusion. IMPRESSION: Persistent soft tissue swelling about the ankle overlying the lateral malleolus. No underlying bony abnormality. Possible small effusion. Findings may be related to soft tissue injury, could also be associated with inflammatory arthropathy. Correlate with any clinical signs of infection. Electronically Signed   By: Zetta Bills M.D.   On: 11/02/2019 10:56   MR ABDOMEN MRCP WO CONTRAST  Result Date: 11/03/2019 CLINICAL DATA:  Jaundice. Elevated liver function tests. Common bile duct dilatation on recent ultrasound. EXAM: MRI ABDOMEN WITHOUT CONTRAST  (INCLUDING MRCP) TECHNIQUE:  Multiplanar multisequence MR imaging of the abdomen was performed. Heavily T2-weighted images of the biliary and pancreatic ducts were obtained, and three-dimensional MRCP images were rendered by post processing. COMPARISON:  10/30/2019 ultrasound and 06/01/2018 CT scan FINDINGS: Despite efforts by the technologist and patient, motion artifact is present on today's exam and could not be eliminated. This reduces exam sensitivity and specificity. This is a common outcome when MRCP is attempted in the inpatient setting where patients are less likely to be able to breath hold and cooperate in controlling motion. Lower chest: Moderate-sized hiatal hernia. Bandlike opacity anteriorly in the right lung on image 1/5 with some accentuated T2 signal, query localized fluid, this is in the region of consolidation shown on the chest CT of 10/21/2019 although the consolidation seems improved. MRI is not an ideal way to assess this pulmonary finding. Hepatobiliary: The common bile duct measures 5 mm in diameter, well within normal limits for the patient's age. However, there is a focal filling defect of low signal intensity in the distal CBD on image 48/7 and image 24/5 measuring about 4 mm in diameter which could represent a distal CBD nonobstructive stone. No significant intrahepatic biliary dilatation. Contracted gallbladder. Pancreas:  Atrophic pancreas especially along the pancreatic body. Spleen:  Unremarkable Adrenals/Urinary Tract:  Unremarkable Stomach/Bowel: Moderate-sized hiatal hernia. Otherwise unremarkable. Vascular/Lymphatic: Aortoiliac atherosclerotic vascular disease. No pathologic adenopathy. Other:  No supplemental non-categorized findings. Musculoskeletal: Lumbar spondylosis and degenerative disc disease. IMPRESSION: 1. There is a 4 mm filling defect in the distal CBD which could represent a nonobstructive distal CBD stone. No biliary dilatation. Common bile duct diameter currently 5 mm, within normal limits  for patient age. 2. Moderate-sized hiatal hernia. 3. Bandlike opacity anteriorly in the right lung, in the region of the consolidation shown on chest CT of 10/21/2019, although the consolidation seems improved compared to the chest CT of 10/21/2019. 4. Lumbar spondylosis and degenerative disc disease. 5. Atrophic pancreas especially along the pancreatic body. 6. Despite efforts by the technologist and patient, motion artifact is present on today's exam and could not be eliminated. This reduces exam sensitivity and specificity. Electronically Signed   By: Van Clines M.D.   On: 11/03/2019 18:11   ECHOCARDIOGRAM COMPLETE  Result Date: 11/02/2019    ECHOCARDIOGRAM REPORT   Patient Name:   ELLIEMAY ARNOLDY Date of Exam: 11/02/2019 Medical Rec #:  [id number]       Height:       63.0 in Accession #:    [id number]      Weight:       105.0 lb Date of Birth:  03/23/1935        BSA:          1.470 m Patient Age:    19 years  BP:           150 /88 mmHg Patient Gender: F               HR:           90 bpm. Exam Location:  Inpatient Procedure: 2D Echo Indications:    CHF 428  History:        Patient has prior history of Echocardiogram examinations, most                 recent 11/15/2013. TIA and Stroke; Risk Factors:Hypertension.  Sonographer:    Jannett Celestine RDCS (AE) Referring Phys: [id number] Man  1. Left ventricular ejection fraction, by estimation, is 60 to 65%. The left ventricle has normal function. The left ventricle has no regional wall motion abnormalities. Left ventricular diastolic function could not be evaluated.  2. Right ventricular systolic function is normal. The right ventricular size is normal.  3. The mitral valve is normal in structure. Trivial mitral valve regurgitation. No evidence of mitral stenosis.  4. The aortic valve is normal in structure. Aortic valve regurgitation is not visualized. No aortic stenosis is present.  5. The inferior vena cava is normal in size with  greater than 50% respiratory variability, suggesting right atrial pressure of 3 mmHg. Conclusion(s)/Recommendation(s): No intracardiac source of embolism detected on this transthoracic study. A transesophageal echocardiogram is recommended to exclude cardiac source of embolism if clinically indicated. FINDINGS  Left Ventricle: Left ventricular ejection fraction, by estimation, is 60 to 65%. The left ventricle has normal function. The left ventricle has no regional wall motion abnormalities. The left ventricular internal cavity size was normal in size. There is  no left ventricular hypertrophy. Left ventricular diastolic function could not be evaluated. Right Ventricle: The right ventricular size is normal. No increase in right ventricular wall thickness. Right ventricular systolic function is normal. Left Atrium: Left atrial size was normal in size. Right Atrium: Right atrial size was normal in size. Pericardium: There is no evidence of pericardial effusion. Mitral Valve: The mitral valve is normal in structure. Normal mobility of the mitral valve leaflets. Moderate mitral annular calcification. Trivial mitral valve regurgitation. No evidence of mitral valve stenosis. Tricuspid Valve: The tricuspid valve is normal in structure. Tricuspid valve regurgitation is mild . No evidence of tricuspid stenosis. Aortic Valve: The aortic valve is normal in structure. Aortic valve regurgitation is not visualized. No aortic stenosis is present. Pulmonic Valve: The pulmonic valve was normal in structure. Pulmonic valve regurgitation is not visualized. No evidence of pulmonic stenosis. Aorta: The aortic root is normal in size and structure. Venous: The inferior vena cava is normal in size with greater than 50% respiratory variability, suggesting right atrial pressure of 3 mmHg. IAS/Shunts: No atrial level shunt detected by color flow Doppler.  LEFT VENTRICLE PLAX 2D LVIDd:         4.70 cm LVIDs:         3.50 cm LV PW:         1.10  cm LV IVS:        0.80 cm LVOT diam:     1.90 cm LV SV:         44 LV SV Index:   30 LVOT Area:     2.84 cm  LEFT ATRIUM             Index LA diam:        3.50 cm 2.38 cm/m LA Vol (A2C):  35.1 ml 23.87 ml/m LA Vol (A4C):   36.5 ml 24.82 ml/m LA Biplane Vol: 39.9 ml 27.13 ml/m  AORTIC VALVE LVOT Vmax:   82.90 cm/s LVOT Vmean:  55.300 cm/s LVOT VTI:    0.155 m  AORTA Ao Root diam: 2.90 cm MITRAL VALVE MV Area (PHT): 2.60 cm    SHUNTS MV Decel Time: 292 msec    Systemic VTI:  0.16 m MV E velocity: 44.60 cm/s  Systemic Diam: 1.90 cm Ena Dawley MD Electronically signed by Ena Dawley MD Signature Date/Time: 11/02/2019/10:43:25 PM    Final     ROS:  As stated above in the HPI otherwise negative.  Blood pressure (!) 158/82, pulse 86, temperature 97.6 F (36.4 C), temperature source Oral, resp. rate 17, height 5\' 3"  (1.6 m), weight 47.6 kg, SpO2 96 %.    PE: Gen: NAD HEENT:  [place]/AT, EOMI Neck: Supple, no LAD Lungs: CTA Bilaterally CV: RRR without M/G/R ABM: Soft, NTND, +BS Ext: No C/C/E  Assessment/Plan: 1) Choldeocholithiasis. 2) Elevated liver enzymes. 3) Elevated TB.   With the patient's clinical presentation and her elevated liver enzymes, further evaluation and treatment will be performed with an ERCP.  I left a message with her husband to go over the ERCP.  Plan: 1) ERCP tomorrow. 2) Continue with antibiotics.  I spoke with her husband, Irena Shaddix.  The risks of bleeding, perforation, and pancreatitis were discussed and the patient's husband wishes to proceed.  Prestyn Stanco D 11/04/2019, 8:32 AM

## 2019-11-04 NOTE — Progress Notes (Signed)
PROGRESS NOTE  Kim Blevins:[id number] DOB: 02-06-1935 DOA: 11/01/2019 PCP: Cathlean Sauer, MD   LOS: 2 days   Brief Narrative / Interim history: 85 year old female with HTN, GERD, protein calorie malnutrition, probable Alzheimer's dementia, was directed to the hospital by PCP. She was hospitalized at [facility] from 4/2-4/5 with pneumonia, was treated with antibiotics and then released home.  Husband tells me that since she has been home she has been having progressive lethargy, sleepiness, as well as increased confusion.  He also reports that she has been having increasing left ankle swelling and pain over the last 4 days.  They saw the PCP the day prior to admission and apparently she was febrile, and with leukocytosis and lethargy the PCP was concerned about an ongoing infectious process and she was directed to the emergency room.  She also has elevated LFTs and CBD dilation on ultrasound and an MRCP has been ordered as an outpatient.  Subjective / 24h Interval events: No complaints this morning, asking when she can go home  Assessment & Plan: Principal Problem Sepsis due to lobar pneumonia-patient met criteria with tachycardia, tachypnea as well as elevated WBC, she was started on ceftriaxone and azithromycin and seems to be improving.  Her white count has now normalized, she is afebrile and her respiratory status has improved.  Continue antibiotics, plan for total of 5 days, today day 3  Active Problems Elevated LFTs with CBD dilation-patient does not have any GI symptoms today however she has poor recollection whether she had GI symptoms at home.  Her AST, ALT are elevated but stable, however total bilirubin increased from 0.8 on admission to 2.4 on 4/16.  She underwent an MRI/MRCP which showed possible filling defect in the CBD.  Consulted gastroenterology today, discussed with Dr. Benson Norway, he evaluated patient and plans for an ERCP tomorrow  Left ankle swelling-improved, started on  colchicine, suspect gout.  X-ray without acute findings.  Her BNP was elevated on admission underwent a 2D echo which was essentially unremarkable with normal ejection fraction.  Essential hypertension-continue amlodipine, lisinopril, blood pressure acceptable  Hyperlipidemia-continue statin  Irregular heart rate-this is sinus arrhythmia, no evidence of A. fib currently  Severe protein calorie malnutrition-chronic, dietary consult  History of TIA-she is on Plavix, continue  Hyperkalemia-may have been spurious, her potassium is actually 3.0 this morning, supplement   Scheduled Meds: . amLODipine  10 mg Oral Daily  . atorvastatin  10 mg Oral Daily  . clopidogrel  75 mg Oral Q breakfast  . colchicine  0.3 mg Oral Daily  . docusate sodium  100 mg Oral BID  . enoxaparin (LOVENOX) injection  30 mg Subcutaneous Daily  . famotidine  20 mg Oral BID  . gabapentin  100 mg Oral QHS  . mirtazapine  30 mg Oral QHS  . potassium chloride  30 mEq Oral Once   Continuous Infusions: . azithromycin 500 mg (11/04/19 0014)  . cefTRIAXone (ROCEPHIN)  IV 1 g (11/03/19 2256)   PRN Meds:.acetaminophen **OR** acetaminophen, morphine injection, ondansetron **OR** ondansetron (ZOFRAN) IV, polyethylene glycol, traMADol  DVT prophylaxis: Lovenox Code Status: DNR Family Communication: Husband at bedside/16 Patient admitted from: Home Anticipated d/c place: To be determined, physical therapy evaluation pending Barriers to d/c: She is to get ERCP tomorrow  Consultants:  None  Procedures:  2D echo:  IMPRESSIONS  1. Left ventricular ejection fraction, by estimation, is 60 to 65%. The left ventricle has normal function. The left ventricle has no regional wall motion abnormalities. Left  ventricular diastolic function could not be evaluated.  2. Right ventricular systolic function is normal. The right ventricular size is normal.  3. The mitral valve is normal in structure. Trivial mitral valve  regurgitation. No evidence of mitral stenosis.  4. The aortic valve is normal in structure. Aortic valve regurgitation is not visualized. No aortic stenosis is present.  5. The inferior vena cava is normal in size with greater than 50% respiratory variability, suggesting right atrial pressure of 3 mmHg.   Microbiology  None   Antimicrobials: Ceftriaxone / Azithromycin 4/15 >>    Objective: Vitals:   11/03/19 1721 11/03/19 2101 11/04/19 0435 11/04/19 1126  BP: (!) 152/80 (!) 169/87 (!) 158/82 132/77  Pulse: 92 82 86 92  Resp: '18 18 17 17  ' Temp: 98.2 F (36.8 C) 98.3 F (36.8 C) 97.6 F (36.4 C) 98.9 F (37.2 C)  TempSrc: Oral Oral Oral Oral  SpO2: 98% 97% 96% 94%  Weight:      Height:        Intake/Output Summary (Last 24 hours) at 11/04/2019 1201 Last data filed at 11/04/2019 0554 Gross per 24 hour  Intake 710 ml  Output 600 ml  Net 110 ml   Filed Weights   11/01/19 2310  Weight: 47.6 kg    Examination:  Constitutional: No distress Eyes: No icterus ENMT: Moist mucous membranes Neck: normal, supple Respiratory: Clear bilaterally without wheezing or crackles Cardiovascular: Regular rate and rhythm without murmurs, no edema Abdomen: Nondistended, soft, no tenderness, bowel sounds positive Musculoskeletal: no clubbing / cyanosis.  Skin: No rashes seen Neurologic: Nonfocal, equal strength  Data Reviewed: I have independently reviewed following labs and imaging studies   CBC: Recent Labs  Lab 11/01/19 2340 11/02/19 0530 11/03/19 0644 11/04/19 0716  WBC 12.2* 11.0* 9.8 8.3  NEUTROABS 10.6* 8.2* 6.1  --   HGB 11.4* 10.6* 11.8* 11.3*  HCT 36.9 34.0* 38.1 35.8*  MCV 95.6 95.8 93.6 92.7  PLT 444* 399 442* 280*   Basic Metabolic Panel: Recent Labs  Lab 11/01/19 2340 11/02/19 0530 11/03/19 0644 11/04/19 0716  NA 135 138 138 139  K 4.2 3.9 5.3* 3.0*  CL 95* 99 96* 97*  CO2 '28 26 24 27  ' GLUCOSE 148* 147* 96 111*  BUN '19 16 11 12  ' CREATININE 0.88 0.70  0.61 0.60  CALCIUM 8.9 8.8* 8.8* 8.9  MG  --   --  1.8  --   PHOS  --  2.8  --   --    Liver Function Tests: Recent Labs  Lab 11/01/19 2340 11/02/19 0530 11/03/19 0644 11/04/19 0716  AST 185*  --  160* 98*  ALT 87*  --  88* 79*  ALKPHOS 135*  --  122 111  BILITOT 0.8  --  2.4* 0.5  PROT 6.5  --  6.0* 6.0*  ALBUMIN 2.3* 2.1* 2.3* 2.4*   Coagulation Profile: Recent Labs  Lab 11/01/19 2340  INR 1.1   HbA1C: No results for input(s): HGBA1C in the last 72 hours. CBG: No results for input(s): GLUCAP in the last 168 hours.  Recent Results (from the past 240 hour(s))  Blood Culture (routine x 2)     Status: None (Preliminary result)   Collection Time: 11/01/19 11:40 PM   Specimen: BLOOD  Result Value Ref Range Status   Specimen Description BLOOD SITE NOT SPECIFIED  Final   Special Requests   Final    BOTTLES DRAWN AEROBIC AND ANAEROBIC Blood Culture results may not be  optimal due to an inadequate volume of blood received in culture bottles   Culture   Final    NO GROWTH 2 DAYS Performed at [facility], [place] [address], [place], [place] [address]    Report Status PENDING  Incomplete  Blood Culture (routine x 2)     Status: None (Preliminary result)   Collection Time: 11/01/19 11:48 PM   Specimen: BLOOD  Result Value Ref Range Status   Specimen Description BLOOD SITE NOT SPECIFIED  Final   Special Requests   Final    BOTTLES DRAWN AEROBIC AND ANAEROBIC Blood Culture adequate volume   Culture   Final    NO GROWTH 2 DAYS Performed at [facility], [address] [place], [place] [address]    Report Status PENDING  Incomplete  SARS CORONAVIRUS 2 (TAT 6-24 HRS) Nasopharyngeal Nasopharyngeal Swab     Status: None   Collection Time: 11/02/19 12:52 AM   Specimen: Nasopharyngeal Swab  Result Value Ref Range Status   SARS Coronavirus 2 NEGATIVE NEGATIVE Final    Comment: (NOTE) SARS-CoV-2 target nucleic acids are NOT DETECTED. The SARS-CoV-2 RNA is generally  detectable in upper and lower respiratory specimens during the acute phase of infection. Negative results do not preclude SARS-CoV-2 infection, do not rule out co-infections with other pathogens, and should not be used as the sole basis for treatment or other patient management decisions. Negative results must be combined with clinical observations, patient history, and epidemiological information. The expected result is Negative. Fact Sheet for Patients: SugarRoll.be Fact Sheet for Healthcare Providers: https://www.woods-mathews.com/ This test is not yet approved or cleared by the [place] FDA and  has been authorized for detection and/or diagnosis of SARS-CoV-2 by FDA under an Emergency Use Authorization (EUA). This EUA will remain  in effect (meaning this test can be used) for the duration of the COVID-19 declaration under Section 56 4(b)(1) of the Act, 21 U.S.C. section 360bbb-3(b)(1), unless the authorization is terminated or revoked sooner. Performed at [facility], [place] [address], [place], [place] [address]   Urine culture     Status: Abnormal   Collection Time: 11/02/19  2:00 AM   Specimen: In/Out Cath Urine  Result Value Ref Range Status   Specimen Description IN/OUT CATH URINE  Final   Special Requests   Final    NONE Performed at [facility], [place] [address] [place], [place] [address]    Culture MULTIPLE SPECIES PRESENT, SUGGEST RECOLLECTION (A)  Final   Report Status 11/02/2019 FINAL  Final     Radiology Studies: MR ABDOMEN MRCP WO CONTRAST  Result Date: 11/03/2019 CLINICAL DATA:  Jaundice. Elevated liver function tests. Common bile duct dilatation on recent ultrasound. EXAM: MRI ABDOMEN WITHOUT CONTRAST  (INCLUDING MRCP) TECHNIQUE: Multiplanar multisequence MR imaging of the abdomen was performed. Heavily T2-weighted images of the biliary and pancreatic ducts were obtained, and three-dimensional MRCP images  were rendered by post processing. COMPARISON:  10/30/2019 ultrasound and 06/01/2018 CT scan FINDINGS: Despite efforts by the technologist and patient, motion artifact is present on today's exam and could not be eliminated. This reduces exam sensitivity and specificity. This is a common outcome when MRCP is attempted in the inpatient setting where patients are less likely to be able to breath hold and cooperate in controlling motion. Lower chest: Moderate-sized hiatal hernia. Bandlike opacity anteriorly in the right lung on image 1/5 with some accentuated T2 signal, query localized fluid, this is in the region  of consolidation shown on the chest CT of 10/21/2019 although the consolidation seems improved. MRI is not an ideal way to assess this pulmonary finding. Hepatobiliary: The common bile duct measures 5 mm in diameter, well within normal limits for the patient's age. However, there is a focal filling defect of low signal intensity in the distal CBD on image 48/7 and image 24/5 measuring about 4 mm in diameter which could represent a distal CBD nonobstructive stone. No significant intrahepatic biliary dilatation. Contracted gallbladder. Pancreas:  Atrophic pancreas especially along the pancreatic body. Spleen:  Unremarkable Adrenals/Urinary Tract:  Unremarkable Stomach/Bowel: Moderate-sized hiatal hernia. Otherwise unremarkable. Vascular/Lymphatic: Aortoiliac atherosclerotic vascular disease. No pathologic adenopathy. Other:  No supplemental non-categorized findings. Musculoskeletal: Lumbar spondylosis and degenerative disc disease. IMPRESSION: 1. There is a 4 mm filling defect in the distal CBD which could represent a nonobstructive distal CBD stone. No biliary dilatation. Common bile duct diameter currently 5 mm, within normal limits for patient age. 2. Moderate-sized hiatal hernia. 3. Bandlike opacity anteriorly in the right lung, in the region of the consolidation shown on chest CT of 10/21/2019, although the  consolidation seems improved compared to the chest CT of 10/21/2019. 4. Lumbar spondylosis and degenerative disc disease. 5. Atrophic pancreas especially along the pancreatic body. 6. Despite efforts by the technologist and patient, motion artifact is present on today's exam and could not be eliminated. This reduces exam sensitivity and specificity. Electronically Signed   By: Van Clines M.D.   On: 11/03/2019 18:11    Marzetta Board, MD, PhD [facility]  Between 7 am - 7 pm I am available, please contact me via Amion or Securechat  Between 7 pm - 7 am I am not available, please contact night coverage MD/APP via Amion

## 2019-11-05 ENCOUNTER — Inpatient Hospital Stay (HOSPITAL_COMMUNITY): Payer: Medicare Other

## 2019-11-05 ENCOUNTER — Encounter (HOSPITAL_COMMUNITY)
Admission: EM | Disposition: A | Discharge status: Skilled Nursing Facility | Payer: Self-pay | Source: Home / Self Care | Attending: Internal Medicine

## 2019-11-05 ENCOUNTER — Inpatient Hospital Stay (HOSPITAL_COMMUNITY): Payer: Medicare Other | Admitting: Certified Registered Nurse Anesthetist

## 2019-11-05 HISTORY — PX: SPHINCTEROTOMY: SHX5544

## 2019-11-05 HISTORY — PX: REMOVAL OF STONES: SHX5545

## 2019-11-05 HISTORY — PX: ERCP: SHX5425

## 2019-11-05 LAB — COMPREHENSIVE METABOLIC PANEL
ALT: 62 U/L — ABNORMAL HIGH (ref 0–44)
AST: 56 U/L — ABNORMAL HIGH (ref 15–41)
Albumin: 2.3 g/dL — ABNORMAL LOW (ref 3.5–5.0)
Alkaline Phosphatase: 107 U/L (ref 38–126)
Anion gap: 11 (ref 5–15)
BUN: 13 mg/dL (ref 8–23)
CO2: 29 mmol/L (ref 22–32)
Calcium: 9.2 mg/dL (ref 8.9–10.3)
Chloride: 101 mmol/L (ref 98–111)
Creatinine, Ser: 0.62 mg/dL (ref 0.44–1.00)
GFR calc Af Amer: >60 mL/min (ref >60)
GFR calc non Af Amer: >60 mL/min (ref >60)
Glucose, Bld: 106 mg/dL — ABNORMAL HIGH (ref 70–99)
Potassium: 3.4 mmol/L — ABNORMAL LOW (ref 3.5–5.1)
Sodium: 141 mmol/L (ref 135–145)
Total Bilirubin: 0.4 mg/dL (ref 0.3–1.2)
Total Protein: 6.1 g/dL — ABNORMAL LOW (ref 6.5–8.1)

## 2019-11-05 LAB — CBC
HCT: 36.7 % (ref 36.0–46.0)
Hemoglobin: 11.4 g/dL — ABNORMAL LOW (ref 12.0–15.0)
MCH: 28.9 pg (ref 26.0–34.0)
MCHC: 31.1 g/dL (ref 30.0–36.0)
MCV: 93.1 fL (ref 80.0–100.0)
Platelets: 442 10*3/uL — ABNORMAL HIGH (ref 150–400)
RBC: 3.94 MIL/uL (ref 3.87–5.11)
RDW: 14.9 % (ref 11.5–15.5)
WBC: 8 10*3/uL (ref 4.0–10.5)
nRBC: 0 % (ref 0.0–0.2)

## 2019-11-05 SURGERY — ERCP, WITH INTERVENTION IF INDICATED
Anesthesia: General

## 2019-11-05 MED ORDER — LIDOCAINE 2% (20 MG/ML) 5 ML SYRINGE
INTRAMUSCULAR | Status: DC | PRN
  Administered 2019-11-05: 40 mg via INTRAVENOUS

## 2019-11-05 MED ORDER — SUGAMMADEX SODIUM 200 MG/2ML IV SOLN
INTRAVENOUS | Status: DC | PRN
  Administered 2019-11-05: 100 mg via INTRAVENOUS

## 2019-11-05 MED ORDER — FENTANYL CITRATE (PF) 250 MCG/5ML IJ SOLN
INTRAMUSCULAR | Status: DC | PRN
  Administered 2019-11-05 (×2): 25 ug via INTRAVENOUS

## 2019-11-05 MED ORDER — LACTATED RINGERS IV SOLN: INTRAVENOUS | Status: DC

## 2019-11-05 MED ORDER — DEXAMETHASONE SODIUM PHOSPHATE 10 MG/ML IJ SOLN
INTRAMUSCULAR | Status: DC | PRN
  Administered 2019-11-05: 10 mg via INTRAVENOUS

## 2019-11-05 MED ORDER — INDOMETHACIN 50 MG RE SUPP
RECTAL | Status: AC
  Filled 2019-11-05: qty 2

## 2019-11-05 MED ORDER — CIPROFLOXACIN IN D5W 400 MG/200ML IV SOLN
INTRAVENOUS | Status: DC | PRN
  Administered 2019-11-05: 400 mg via INTRAVENOUS

## 2019-11-05 MED ORDER — CIPROFLOXACIN IN D5W 400 MG/200ML IV SOLN
INTRAVENOUS | Status: AC
  Filled 2019-11-05: qty 200

## 2019-11-05 MED ORDER — ONDANSETRON HCL 4 MG/2ML IJ SOLN
INTRAMUSCULAR | Status: DC | PRN
  Administered 2019-11-05: 4 mg via INTRAVENOUS

## 2019-11-05 MED ORDER — INDOMETHACIN 50 MG RE SUPP
RECTAL | Status: DC | PRN
  Administered 2019-11-05: 100 mg via RECTAL

## 2019-11-05 MED ORDER — ROCURONIUM BROMIDE 10 MG/ML (PF) SYRINGE
PREFILLED_SYRINGE | INTRAVENOUS | Status: DC | PRN
  Administered 2019-11-05: 40 mg via INTRAVENOUS

## 2019-11-05 MED ORDER — SODIUM CHLORIDE 0.9 % IV SOLN
INTRAVENOUS | Status: DC | PRN
  Administered 2019-11-05: 11:00:00 20 mL

## 2019-11-05 MED ORDER — SODIUM CHLORIDE 0.9 % IV SOLN: INTRAVENOUS | Status: DC

## 2019-11-05 MED ORDER — GLUCAGON HCL RDNA (DIAGNOSTIC) 1 MG IJ SOLR
INTRAMUSCULAR | Status: AC
  Filled 2019-11-05: qty 1

## 2019-11-05 MED ORDER — PROPOFOL 10 MG/ML IV BOLUS
INTRAVENOUS | Status: DC | PRN
  Administered 2019-11-05: 50 mg via INTRAVENOUS

## 2019-11-05 NOTE — Progress Notes (Signed)
PROGRESS NOTE  Kim Blevins YQM:[id number] DOB: 1935-02-12 DOA: 11/01/2019 PCP: Cathlean Sauer, MD   LOS: 3 days   Brief Narrative / Interim history: 85 year old female with HTN, GERD, protein calorie malnutrition, probable Alzheimer's dementia, was directed to the hospital by PCP. She was hospitalized at [facility] from 4/2-4/5 with pneumonia, was treated with antibiotics and then released home.  Husband tells me that since she has been home she has been having progressive lethargy, sleepiness, as well as increased confusion.  He also reports that she has been having increasing left ankle swelling and pain over the last 4 days.  They saw the PCP the day prior to admission and apparently she was febrile, and with leukocytosis and lethargy the PCP was concerned about an ongoing infectious process and she was directed to the emergency room.  She also has elevated LFTs and CBD dilation on ultrasound and an MRCP has been ordered as an outpatient.  Subjective / 24h Interval events: No complaints  Assessment & Plan: Principal Problem Sepsis due to lobar pneumonia-patient met criteria with tachycardia, tachypnea as well as elevated WBC, she was started on ceftriaxone and azithromycin and seems to be improving.  Her white count has now normalized, she is afebrile and her respiratory status has improved.  Continue antibiotics, plan for total of 5 days, today day 4  Active Problems Elevated LFTs with CBD dilation-patient does not have any GI symptoms today however she has poor recollection whether she had GI symptoms at home.  Her AST, ALT are elevated but stable, however total bilirubin increased from 0.8 on admission to 2.4 on 4/16.  She underwent an MRI/MRCP which showed possible filling defect in the CBD.  Consulted gastroenterology.  She underwent ERCP today which was negative.  LFTs are now normalized.  Given positive MRCP and CBD dilation on ultrasound is possible that she may have passed a small  stone.  Monitor post ERCP anticipate home discharge within 24 hours if things are stable  Left ankle swelling-improved, started on colchicine, suspect gout.  X-ray without acute findings.  Her BNP was elevated on admission underwent a 2D echo which was essentially unremarkable with normal ejection fraction.  Essential hypertension-continue amlodipine, lisinopril, blood pressure acceptable  Hyperlipidemia-continue statin  Irregular heart rate-this is sinus arrhythmia, no evidence of A. fib currently  Severe protein calorie malnutrition-chronic, dietary consult  History of TIA-she is on Plavix, continue   Scheduled Meds: . amLODipine  10 mg Oral Daily  . atorvastatin  10 mg Oral Daily  . clopidogrel  75 mg Oral Q breakfast  . colchicine  0.3 mg Oral Daily  . docusate sodium  100 mg Oral BID  . enoxaparin (LOVENOX) injection  30 mg Subcutaneous Daily  . famotidine  20 mg Oral BID  . gabapentin  100 mg Oral QHS  . mirtazapine  30 mg Oral QHS   Continuous Infusions: . azithromycin 500 mg (11/04/19 2253)  . cefTRIAXone (ROCEPHIN)  IV 1 g (11/04/19 2208)   PRN Meds:.acetaminophen **OR** acetaminophen, morphine injection, ondansetron **OR** ondansetron (ZOFRAN) IV, polyethylene glycol, traMADol  DVT prophylaxis: Lovenox Code Status: DNR Family Communication: Husband at bedside/16 Patient admitted from: Home Anticipated d/c place: To be determined, physical therapy evaluation pending Barriers to d/c: ERCP today, monitor post procedure, home within 24 hours  Consultants:  None  Procedures:  2D echo:  IMPRESSIONS  1. Left ventricular ejection fraction, by estimation, is 60 to 65%. The left ventricle has normal function. The left ventricle has no  regional wall motion abnormalities. Left ventricular diastolic function could not be evaluated.  2. Right ventricular systolic function is normal. The right ventricular size is normal.  3. The mitral valve is normal in structure.  Trivial mitral valve regurgitation. No evidence of mitral stenosis.  4. The aortic valve is normal in structure. Aortic valve regurgitation is not visualized. No aortic stenosis is present.  5. The inferior vena cava is normal in size with greater than 50% respiratory variability, suggesting right atrial pressure of 3 mmHg.   Microbiology  None   Antimicrobials: Ceftriaxone / Azithromycin 4/15 >>    Objective: Vitals:   11/05/19 0958 11/05/19 1130 11/05/19 1140 11/05/19 1149  BP: (!) 169/84 (!) 167/71 (!) 143/69 (!) 162/62  Pulse: 98 100 96 94  Resp: 17 (!) 25 (!) 21 18  Temp:  98.5 F (36.9 C)    TempSrc: Oral Axillary    SpO2: 96% 99% 96% 94%  Weight:      Height:        Intake/Output Summary (Last 24 hours) at 11/05/2019 1228 Last data filed at 11/05/2019 1117 Gross per 24 hour  Intake 770 ml  Output 601 ml  Net 169 ml   Filed Weights   11/01/19 2310 11/04/19 2108  Weight: 47.6 kg 45.4 kg    Examination:  Constitutional: No distress Eyes: No icterus ENMT: Moist mucous membranes Neck: normal, supple Respiratory: Clear bilaterally, no wheezing or crackles Cardiovascular: Regular rate and rhythm, no murmurs Abdomen: Soft, NT, ND, bowel sounds positive Musculoskeletal: no clubbing / cyanosis.  Skin: No rashes Neurologic: No focal deficits  Data Reviewed: I have independently reviewed following labs and imaging studies   CBC: Recent Labs  Lab 11/01/19 2340 11/02/19 0530 11/03/19 0644 11/04/19 0716 11/05/19 0529  WBC 12.2* 11.0* 9.8 8.3 8.0  NEUTROABS 10.6* 8.2* 6.1  --   --   HGB 11.4* 10.6* 11.8* 11.3* 11.4*  HCT 36.9 34.0* 38.1 35.8* 36.7  MCV 95.6 95.8 93.6 92.7 93.1  PLT 444* 399 442* 484* 790*   Basic Metabolic Panel: Recent Labs  Lab 11/01/19 2340 11/02/19 0530 11/03/19 0644 11/04/19 0716 11/05/19 0529  NA 135 138 138 139 141  K 4.2 3.9 5.3* 3.0* 3.4*  CL 95* 99 96* 97* 101  CO2 '28 26 24 27 29  ' GLUCOSE 148* 147* 96 111* 106*  BUN '19  16 11 12 13  ' CREATININE 0.88 0.70 0.61 0.60 0.62  CALCIUM 8.9 8.8* 8.8* 8.9 9.2  MG  --   --  1.8  --   --   PHOS  --  2.8  --   --   --    Liver Function Tests: Recent Labs  Lab 11/01/19 2340 11/02/19 0530 11/03/19 0644 11/04/19 0716 11/05/19 0529  AST 185*  --  160* 98* 56*  ALT 87*  --  88* 79* 62*  ALKPHOS 135*  --  122 111 107  BILITOT 0.8  --  2.4* 0.5 0.4  PROT 6.5  --  6.0* 6.0* 6.1*  ALBUMIN 2.3* 2.1* 2.3* 2.4* 2.3*   Coagulation Profile: Recent Labs  Lab 11/01/19 2340  INR 1.1   HbA1C: No results for input(s): HGBA1C in the last 72 hours. CBG: No results for input(s): GLUCAP in the last 168 hours.  Recent Results (from the past 240 hour(s))  Blood Culture (routine x 2)     Status: None (Preliminary result)   Collection Time: 11/01/19 11:40 PM   Specimen: BLOOD  Result Value Ref  Range Status   Specimen Description BLOOD SITE NOT SPECIFIED  Final   Special Requests   Final    BOTTLES DRAWN AEROBIC AND ANAEROBIC Blood Culture results may not be optimal due to an inadequate volume of blood received in culture bottles   Culture   Final    NO GROWTH 3 DAYS Performed at [facility], [place] [address], [place], [place] [address]    Report Status PENDING  Incomplete  Blood Culture (routine x 2)     Status: None (Preliminary result)   Collection Time: 11/01/19 11:48 PM   Specimen: BLOOD  Result Value Ref Range Status   Specimen Description BLOOD SITE NOT SPECIFIED  Final   Special Requests   Final    BOTTLES DRAWN AEROBIC AND ANAEROBIC Blood Culture adequate volume   Culture   Final    NO GROWTH 3 DAYS Performed at [facility], [address] [place], [place] [address]    Report Status PENDING  Incomplete  SARS CORONAVIRUS 2 (TAT 6-24 HRS) Nasopharyngeal Nasopharyngeal Swab     Status: None   Collection Time: 11/02/19 12:52 AM   Specimen: Nasopharyngeal Swab  Result Value Ref Range Status   SARS Coronavirus 2 NEGATIVE NEGATIVE Final     Comment: (NOTE) SARS-CoV-2 target nucleic acids are NOT DETECTED. The SARS-CoV-2 RNA is generally detectable in upper and lower respiratory specimens during the acute phase of infection. Negative results do not preclude SARS-CoV-2 infection, do not rule out co-infections with other pathogens, and should not be used as the sole basis for treatment or other patient management decisions. Negative results must be combined with clinical observations, patient history, and epidemiological information. The expected result is Negative. Fact Sheet for Patients: SugarRoll.be Fact Sheet for Healthcare Providers: https://www.woods-mathews.com/ This test is not yet approved or cleared by the [place] FDA and  has been authorized for detection and/or diagnosis of SARS-CoV-2 by FDA under an Emergency Use Authorization (EUA). This EUA will remain  in effect (meaning this test can be used) for the duration of the COVID-19 declaration under Section 56 4(b)(1) of the Act, 21 U.S.C. section 360bbb-3(b)(1), unless the authorization is terminated or revoked sooner. Performed at [facility], [place] [address] [place], [place] [address]   Urine culture     Status: Abnormal   Collection Time: 11/02/19  2:00 AM   Specimen: In/Out Cath Urine  Result Value Ref Range Status   Specimen Description IN/OUT CATH URINE  Final   Special Requests   Final    NONE Performed at [facility], [place] [address] [place], [place] [address]    Culture MULTIPLE SPECIES PRESENT, SUGGEST RECOLLECTION (A)  Final   Report Status 11/02/2019 FINAL  Final     Radiology Studies: DG ERCP BILIARY & PANCREATIC DUCTS  Result Date: 11/05/2019 CLINICAL DATA:  85 year old female with choledocholithiasis EXAM: ERCP TECHNIQUE: Multiple spot images obtained with the fluoroscopic device and submitted for interpretation post-procedure. FLUOROSCOPY TIME:  Fluoroscopy Time:  1 minutes 42  seconds Radia COMPARISON:  None. FINDINGS: Limited intraoperative fluoroscopic images during ERCP. Initial image demonstrates the endoscope projecting over the upper abdomen with a safety wire in position in the extrahepatic biliary ducts and partial opacification with contrast. Subsequently there is deployment of a retrieval balloon. Final image demonstrates no definite filling defects. IMPRESSION: Limited images during ERCP demonstrates treatment of the extrahepatic biliary ducts with deployment of balloon retrieval catheter. Please refer to the dictated operative report  for full details of intraoperative findings and procedure. Electronically Signed   By: Corrie Mckusick D.O.   On: 11/05/2019 11:57   DG C-Arm 1-60 Min  Result Date: 11/05/2019 CLINICAL DATA:  85 year old female with choledocholithiasis EXAM: ERCP TECHNIQUE: Multiple spot images obtained with the fluoroscopic device and submitted for interpretation post-procedure. FLUOROSCOPY TIME:  Fluoroscopy Time:  1 minutes 42 seconds Radia COMPARISON:  None. FINDINGS: Limited intraoperative fluoroscopic images during ERCP. Initial image demonstrates the endoscope projecting over the upper abdomen with a safety wire in position in the extrahepatic biliary ducts and partial opacification with contrast. Subsequently there is deployment of a retrieval balloon. Final image demonstrates no definite filling defects. IMPRESSION: Limited images during ERCP demonstrates treatment of the extrahepatic biliary ducts with deployment of balloon retrieval catheter. Please refer to the dictated operative report for full details of intraoperative findings and procedure. Electronically Signed   By: Corrie Mckusick D.O.   On: 11/05/2019 11:57    Marzetta Board, MD, PhD [facility]  Between 7 am - 7 pm I am available, please contact me via Amion or Securechat  Between 7 pm - 7 am I am not available, please contact night coverage MD/APP via Amion

## 2019-11-05 NOTE — Anesthesia Procedure Notes (Signed)
Procedure Name: Intubation Date/Time: 11/05/2019 10:39 AM Performed by: Clearnce Sorrel, CRNA Pre-anesthesia Checklist: Patient identified, Emergency Drugs available, Suction available, Patient being monitored and Timeout performed Patient Re-evaluated:Patient Re-evaluated prior to induction Oxygen Delivery Method: Circle system utilized Preoxygenation: Pre-oxygenation with 100% oxygen Induction Type: IV induction Ventilation: Mask ventilation without difficulty Laryngoscope Size: Mac and 3 Grade View: Grade I Tube type: Oral Tube size: 7.0 mm Number of attempts: 1 Airway Equipment and Method: Stylet Placement Confirmation: ETT inserted through vocal cords under direct vision,  positive ETCO2 and breath sounds checked- equal and bilateral Secured at: 22 cm Tube secured with: Tape Dental Injury: Teeth and Oropharynx as per pre-operative assessment

## 2019-11-05 NOTE — Transfer of Care (Signed)
Immediate Anesthesia Transfer of Care Note  Patient: Kim Blevins  Procedure(s) Performed: ENDOSCOPIC RETROGRADE CHOLANGIOPANCREATOGRAPHY (ERCP) (N/A ) SPHINCTEROTOMY REMOVAL OF STONES  Patient Location: Endoscopy Unit  Anesthesia Type:General  Level of Consciousness: awake  Airway & Oxygen Therapy: Patient Spontanous Breathing and Patient connected to nasal cannula oxygen  Post-op Assessment: Report given to RN and Post -op Vital signs reviewed and stable  Post vital signs: Reviewed and stable  Last Vitals:  Vitals Value Taken Time  BP    Temp    Pulse    Resp    SpO2      Last Pain:  Vitals:   11/05/19 0958  TempSrc: Oral  PainSc: 0-No pain         Complications: No apparent anesthesia complications

## 2019-11-05 NOTE — Anesthesia Postprocedure Evaluation (Signed)
Anesthesia Post Note  Patient: Kim Blevins  Procedure(s) Performed: ENDOSCOPIC RETROGRADE CHOLANGIOPANCREATOGRAPHY (ERCP) (N/A ) SPHINCTEROTOMY REMOVAL OF STONES     Patient location during evaluation: Endoscopy Anesthesia Type: General Level of consciousness: awake and alert Pain management: pain level controlled Vital Signs Assessment: post-procedure vital signs reviewed and stable Respiratory status: spontaneous breathing, nonlabored ventilation, respiratory function stable and patient connected to nasal cannula oxygen Cardiovascular status: blood pressure returned to baseline and stable Postop Assessment: no apparent nausea or vomiting Anesthetic complications: no    Last Vitals:  Vitals:   11/05/19 1140 11/05/19 1149  BP: (!) 143/69 (!) 162/62  Pulse: 96 94  Resp: (!) 21 18  Temp:    SpO2: 96% 94%    Last Pain:  Vitals:   11/05/19 1149  TempSrc:   PainSc: 0-No pain                 Sharina Petre

## 2019-11-05 NOTE — Op Note (Signed)
[facility] Patient Name: Kim Blevins Procedure Date : 11/05/2019 MRN: [id number] Attending MD: Carol Ada , MD Date of Birth: [birth year] CSN: [id number] Age: 85 Admit Type: Inpatient Procedure:                ERCP Indications:              Common bile duct stone(s) Providers:                Carol Ada, MD, Grace Isaac, RN, Janeece Agee,                            Technician Referring MD:              Medicines:                General Anesthesia Complications:            No immediate complications. Estimated Blood Loss:     Estimated blood loss: none. Procedure:                Pre-Anesthesia Assessment:                           - Prior to the procedure, a History and Physical                            was performed, and patient medications and                            allergies were reviewed. The patient's tolerance of                            previous anesthesia was also reviewed. The risks                            and benefits of the procedure and the sedation                            options and risks were discussed with the patient.                            All questions were answered, and informed consent                            was obtained. Prior Anticoagulants: The patient has                            taken no previous anticoagulant or antiplatelet                            agents. ASA Grade Assessment: III - A patient with                            severe systemic disease. After reviewing the risks                            and  benefits, the patient was deemed in                            satisfactory condition to undergo the procedure.                           - Sedation was administered by an anesthesia                            professional. General anesthesia was attained.                           After obtaining informed consent, the scope was                            passed under direct vision. Throughout the              procedure, the patient's blood pressure, pulse, and                            oxygen saturations were monitored continuously. The                            [id number]) Olympus Duodensocope was                            introduced through the mouth, and used to inject                            contrast into and used to inject contrast into the                            bile duct. The ERCP was accomplished without                            difficulty. The patient tolerated the procedure                            well. Scope In: Scope Out: Findings:      The major papilla was normal. The bile duct was deeply cannulated with       the short-nosed traction sphincterotome. Contrast was injected. I       personally interpreted the bile duct images. There was brisk flow of       contrast through the ducts. Image quality was excellent. Contrast       extended to the hepatic ducts. The entire opacified area was normal. A       long 0.035 inch Soft Jagwire was passed into the biliary tree. A 10 mm       biliary sphincterotomy was made with a traction (standard)       sphincterotome using ERBE electrocautery. There was no       post-sphincterotomy bleeding. The biliary tree was swept with a 12 mm       balloon starting at the bifurcation. Nothing was found.      The CBD was easily cannulated during the first attempt. There  was no       manipulation of the PD. Contrast injection did not show any evidence of       any retained stones as noted on the MRCP. Sweeping the duct 4 times did       not yield any stones. Impression:               - The major papilla appeared normal.                           - A biliary sphincterotomy was performed.                           - The biliary tree was swept and nothing was found. Recommendation:           - Return patient to hospital ward for ongoing care.                           - Resume regular diet. Procedure Code(s):        ---  Professional ---                           [phone], Endoscopic retrograde                            cholangiopancreatography (ERCP); with                            sphincterotomy/papillotomy Diagnosis Code(s):        --- Professional ---                           K80.50, Calculus of bile duct without cholangitis                            or cholecystitis without obstruction CPT copyright 2019 American Medical Association. All rights reserved. The codes documented in this report are preliminary and upon coder review may  be revised to meet current compliance requirements. Carol Ada, MD Carol Ada, MD 11/05/2019 11:23:32 AM This report has been signed electronically. Number of Addenda: 0

## 2019-11-05 NOTE — Anesthesia Preprocedure Evaluation (Addendum)
Anesthesia Evaluation  Patient identified by MRN, date of birth, ID band Patient awake    Reviewed: Allergy & Precautions, NPO status , Patient's Chart, lab work & pertinent test results  History of Anesthesia Complications Negative for: history of anesthetic complications  Airway Mallampati: II  TM Distance: >3 FB Neck ROM: Full    Dental  (+) Dental Advisory Given   Pulmonary shortness of breath, pneumonia, neg COPD,    breath sounds clear to auscultation       Cardiovascular hypertension, Pt. on medications (-) angina(-) Past MI and (-) CHF  Rhythm:Regular  2021 tte: 1. Left ventricular ejection fraction, by estimation, is 60 to 65%. The  left ventricle has normal function. The left ventricle has no regional  wall motion abnormalities. Left ventricular diastolic function could not  be evaluated.  2. Right ventricular systolic function is normal. The right ventricular  size is normal.  3. The mitral valve is normal in structure. Trivial mitral valve  regurgitation. No evidence of mitral stenosis.  4. The aortic valve is normal in structure. Aortic valve regurgitation is  not visualized. No aortic stenosis is present.  5. The inferior vena cava is normal in size with greater than 50%  respiratory variability, suggesting right atrial pressure of 3 mmHg.    Neuro/Psych PSYCHIATRIC DISORDERS TIACVA, Residual Symptoms    GI/Hepatic Neg liver ROS, GERD  ,  Endo/Other  Prednisone 20mg  daily for SLE  Renal/GU negative Renal ROS     Musculoskeletal negative musculoskeletal ROS (+)   Abdominal   Peds  Hematology  (+) Blood dyscrasia, anemia , plavix   Anesthesia Other Findings   Reproductive/Obstetrics                            Anesthesia Physical Anesthesia Plan  ASA: III  Anesthesia Plan: General   Post-op Pain Management:    Induction: Intravenous  PONV Risk Score and Plan: 3  and Ondansetron and Dexamethasone  Airway Management Planned: Oral ETT  Additional Equipment: None  Intra-op Plan:   Post-operative Plan: Extubation in OR  Informed Consent: I have reviewed the patients History and Physical, chart, labs and discussed the procedure including the risks, benefits and alternatives for the proposed anesthesia with the patient or authorized representative who has indicated his/her understanding and acceptance.     Dental advisory given  Plan Discussed with: CRNA and Surgeon  Anesthesia Plan Comments:         Anesthesia Quick Evaluation

## 2019-11-06 ENCOUNTER — Encounter: Payer: Self-pay | Admitting: *Deleted

## 2019-11-06 LAB — CBC
HCT: 33.4 % — ABNORMAL LOW (ref 36.0–46.0)
Hemoglobin: 10.7 g/dL — ABNORMAL LOW (ref 12.0–15.0)
MCH: 29.5 pg (ref 26.0–34.0)
MCHC: 32 g/dL (ref 30.0–36.0)
MCV: 92 fL (ref 80.0–100.0)
Platelets: 542 10*3/uL — ABNORMAL HIGH (ref 150–400)
RBC: 3.63 MIL/uL — ABNORMAL LOW (ref 3.87–5.11)
RDW: 14.6 % (ref 11.5–15.5)
WBC: 14.4 10*3/uL — ABNORMAL HIGH (ref 4.0–10.5)
nRBC: 0 % (ref 0.0–0.2)

## 2019-11-06 LAB — BASIC METABOLIC PANEL
Anion gap: 13 (ref 5–15)
BUN: 24 mg/dL — ABNORMAL HIGH (ref 8–23)
CO2: 28 mmol/L (ref 22–32)
Calcium: 9.4 mg/dL (ref 8.9–10.3)
Chloride: 99 mmol/L (ref 98–111)
Creatinine, Ser: 0.73 mg/dL (ref 0.44–1.00)
GFR calc Af Amer: >60 mL/min (ref >60)
GFR calc non Af Amer: >60 mL/min (ref >60)
Glucose, Bld: 134 mg/dL — ABNORMAL HIGH (ref 70–99)
Potassium: 3.3 mmol/L — ABNORMAL LOW (ref 3.5–5.1)
Sodium: 140 mmol/L (ref 135–145)

## 2019-11-06 NOTE — Progress Notes (Signed)
PROGRESS NOTE  Kim Blevins JEH:[id number] DOB: 04/09/1935 DOA: 11/01/2019 PCP: Cathlean Sauer, MD   LOS: 4 days   Brief Narrative / Interim history: 85 year old female with HTN, GERD, protein calorie malnutrition, probable Alzheimer's dementia, was directed to the hospital by PCP. She was hospitalized at [facility] from 4/2-4/5 with pneumonia, was treated with antibiotics and then released home.  Husband tells me that since she has been home she has been having progressive lethargy, sleepiness, as well as increased confusion.  He also reports that she has been having increasing left ankle swelling and pain over the last 4 days.  They saw the PCP the day prior to admission and apparently she was febrile, and with leukocytosis and lethargy the PCP was concerned about an ongoing infectious process and she was directed to the emergency room.  She also has elevated LFTs and CBD dilation on ultrasound and an MRCP has been ordered as an outpatient.  Subjective / 24h Interval events: Wants to go home  Assessment & Plan: Principal Problem Sepsis due to lobar pneumonia-patient met criteria with tachycardia, tachypnea as well as elevated WBC, she was started on ceftriaxone and azithromycin and seems to be improving.  Her white count has now normalized, she is afebrile and her respiratory status has improved.  Continue antibiotics, plan for total of 5 days, today day 5  Active Problems Elevated LFTs with CBD dilation-patient does not have any GI symptoms however she has poor recollection whether she had GI symptoms at home.  Her AST, ALT are elevated but stable, however total bilirubin increased from 0.8 on admission to 2.4 on 4/16.  She underwent an MRI/MRCP which showed possible filling defect in the CBD.  Consulted gastroenterology.  She underwent ERCP which was negative.  LFTs are now normalized.  Given positive MRCP and CBD dilation on ultrasound is possible that she may have passed a small stone.  Tolerating diet  Disposition - patient exhibiting significant weakness today. D/w son over the phone who expresses concerns regarding home safety given weakness. PT re-evaluated, recommending now SNF, TOC consulted.  Left ankle swelling- started on colchicine, suspect gout.  X-ray without acute findings.  Her BNP was elevated on admission underwent a 2D echo which was essentially unremarkable with normal ejection fraction. Resolved  Essential hypertension-continue regimen as below  Hyperlipidemia-continue statin  Irregular heart rate-this is sinus arrhythmia, no evidence of A. fib currently  Severe protein calorie malnutrition-chronic, dietary consult  History of TIA-she is on Plavix, continue   Scheduled Meds: . amLODipine  10 mg Oral Daily  . atorvastatin  10 mg Oral Daily  . clopidogrel  75 mg Oral Q breakfast  . colchicine  0.3 mg Oral Daily  . docusate sodium  100 mg Oral BID  . enoxaparin (LOVENOX) injection  30 mg Subcutaneous Daily  . famotidine  20 mg Oral BID  . gabapentin  100 mg Oral QHS  . mirtazapine  30 mg Oral QHS   Continuous Infusions:  PRN Meds:.acetaminophen **OR** acetaminophen, morphine injection, ondansetron **OR** ondansetron (ZOFRAN) IV, polyethylene glycol, traMADol  DVT prophylaxis: Lovenox Code Status: DNR Family Communication: Husband at bedside/16 Patient admitted from: Home Anticipated d/c place: To be determined, physical therapy evaluation pending Barriers to d/c: ERCP today, monitor post procedure, home within 24 hours  Consultants:  None  Procedures:  2D echo:  IMPRESSIONS  1. Left ventricular ejection fraction, by estimation, is 60 to 65%. The left ventricle has normal function. The left ventricle has no regional wall  motion abnormalities. Left ventricular diastolic function could not be evaluated.  2. Right ventricular systolic function is normal. The right ventricular size is normal.  3. The mitral valve is normal in structure.  Trivial mitral valve regurgitation. No evidence of mitral stenosis.  4. The aortic valve is normal in structure. Aortic valve regurgitation is not visualized. No aortic stenosis is present.  5. The inferior vena cava is normal in size with greater than 50% respiratory variability, suggesting right atrial pressure of 3 mmHg.   Microbiology  None   Antimicrobials: Ceftriaxone / Azithromycin 4/15 >>    Objective: Vitals:   11/05/19 1636 11/05/19 2147 11/06/19 0531 11/06/19 0719  BP: (!) 141/81 (!) 164/75 (!) 186/90 (!) 157/98  Pulse: (!) 104 100 (!) 106 (!) 102  Resp: _0 Temp: 98.8 F (37.1 C) 98.5 F (36.9 C) 98.3 F (36.8 C) 98 F (36.7 C)  TempSrc: Oral Oral Oral Oral  SpO2: 96% 97% 97% 95%  Weight:  40.8 kg    Height:        Intake/Output Summary (Last 24 hours) at 11/06/2019 1510 Last data filed at 11/06/2019 1403 Gross per 24 hour  Intake 960 ml  Output 1050 ml  Net -90 ml   Filed Weights   11/01/19 2310 11/04/19 2108 11/05/19 2147  Weight: 47.6 kg 45.4 kg 40.8 kg    Examination:  Constitutional: NAD Eyes: no icterus ENMT: mmm Neck: normal, supple Respiratory: cta bil, no wheezing Cardiovascular: rrr, no mrg, no edema Abdomen: soft, nt, nd Musculoskeletal: no clubbing / cyanosis.  Skin: no rashes Neurologic: non focal, equal strength  Data Reviewed: I have independently reviewed following labs and imaging studies   CBC: Recent Labs  Lab 11/01/19 2340 11/01/19 2340 11/02/19 0530 11/03/19 0644 11/04/19 0716 11/05/19 0529 11/06/19 0457  WBC 12.2*   < > 11.0* 9.8 8.3 8.0 14.4*  NEUTROABS 10.6*  --  8.2* 6.1  --   --   --   HGB 11.4*   < > 10.6* 11.8* 11.3* 11.4* 10.7*  HCT 36.9   < > 34.0* 38.1 35.8* 36.7 33.4*  MCV 95.6   < > 95.8 93.6 92.7 93.1 92.0  PLT 444*   < > 399 442* 484* 442* 542*   < > = values in this interval not displayed.   Basic Metabolic Panel: Recent Labs  Lab 11/02/19 0530 11/03/19 0644 11/04/19 0716 11/05/19  0529 11/06/19 0457  NA 138 138 139 141 140  K 3.9 5.3* 3.0* 3.4* 3.3*  CL 99 96* 97* 101 99  CO2 _1 GLUCOSE 147* 96 111* 106* 134*  BUN _2 24*  CREATININE 0.70 0.61 0.60 0.62 0.73  CALCIUM 8.8* 8.8* 8.9 9.2 9.4  MG  --  1.8  --   --   --   PHOS 2.8  --   --   --   --    Liver Function Tests: Recent Labs  Lab 11/01/19 2340 11/02/19 0530 11/03/19 0644 11/04/19 0716 11/05/19 0529  AST 185*  --  160* 98* 56*  ALT 87*  --  88* 79* 62*  ALKPHOS 135*  --  122 111 107  BILITOT 0.8  --  2.4* 0.5 0.4  PROT 6.5  --  6.0* 6.0* 6.1*  ALBUMIN 2.3* 2.1* 2.3* 2.4* 2.3*   Coagulation Profile: Recent Labs  Lab 11/01/19 2340  INR 1.1   HbA1C: No results for input(s): HGBA1C in the  last 72 hours. CBG: No results for input(s): GLUCAP in the last 168 hours.  Recent Results (from the past 240 hour(s))  Blood Culture (routine x 2)     Status: None (Preliminary result)   Collection Time: 11/01/19 11:40 PM   Specimen: BLOOD  Result Value Ref Range Status   Specimen Description BLOOD SITE NOT SPECIFIED  Final   Special Requests   Final    BOTTLES DRAWN AEROBIC AND ANAEROBIC Blood Culture results may not be optimal due to an inadequate volume of blood received in culture bottles   Culture   Final    NO GROWTH 4 DAYS Performed at [facility], [place] [address] [place], [place] [address]    Report Status PENDING  Incomplete  Blood Culture (routine x 2)     Status: None (Preliminary result)   Collection Time: 11/01/19 11:48 PM   Specimen: BLOOD  Result Value Ref Range Status   Specimen Description BLOOD SITE NOT SPECIFIED  Final   Special Requests   Final    BOTTLES DRAWN AEROBIC AND ANAEROBIC Blood Culture adequate volume   Culture   Final    NO GROWTH 4 DAYS Performed at [facility], [address] [place], [place] [address]    Report Status PENDING  Incomplete  SARS CORONAVIRUS 2 (TAT 6-24 HRS) Nasopharyngeal Nasopharyngeal Swab     Status: None    Collection Time: 11/02/19 12:52 AM   Specimen: Nasopharyngeal Swab  Result Value Ref Range Status   SARS Coronavirus 2 NEGATIVE NEGATIVE Final    Comment: (NOTE) SARS-CoV-2 target nucleic acids are NOT DETECTED. The SARS-CoV-2 RNA is generally detectable in upper and lower respiratory specimens during the acute phase of infection. Negative results do not preclude SARS-CoV-2 infection, do not rule out co-infections with other pathogens, and should not be used as the sole basis for treatment or other patient management decisions. Negative results must be combined with clinical observations, patient history, and epidemiological information. The expected result is Negative. Fact Sheet for Patients: SugarRoll.be Fact Sheet for Healthcare Providers: https://www.woods-mathews.com/ This test is not yet approved or cleared by the [place] FDA and  has been authorized for detection and/or diagnosis of SARS-CoV-2 by FDA under an Emergency Use Authorization (EUA). This EUA will remain  in effect (meaning this test can be used) for the duration of the COVID-19 declaration under Section 56 4(b)(1) of the Act, 21 U.S.C. section 360bbb-3(b)(1), unless the authorization is terminated or revoked sooner. Performed at [facility], [place] [address], [place], [place] [address]   Urine culture     Status: Abnormal   Collection Time: 11/02/19  2:00 AM   Specimen: In/Out Cath Urine  Result Value Ref Range Status   Specimen Description IN/OUT CATH URINE  Final   Special Requests   Final    NONE Performed at [facility], [place] [address] [place],  [address]    Culture MULTIPLE SPECIES PRESENT, SUGGEST RECOLLECTION (A)  Final   Report Status 11/02/2019 FINAL  Final     Radiology Studies: No results found.  Marzetta Board, MD, PhD [facility]  Between 7 am - 7 pm I am available, please contact me via Amion or Securechat   Between 7 pm - 7 am I am not available, please contact night coverage MD/APP via Amion

## 2019-11-06 NOTE — NC FL2 (Signed)
[place] MEDICAID FL2 LEVEL OF CARE SCREENING TOOL     IDENTIFICATION  Patient Name: Kim Blevins Birthdate: 20-Feb-1935 Sex: female Admission Date (Current Location): 11/01/2019  [facility] and [place] Number:  Herbalist and Address:  [facility] [place], [place] [address], [place], [place] [address]      Provider Number: [id number]  Attending Physician Name and Address:  Caren Griffins, MD  Relative Name and Phone Number:       Current Level of Care: Hospital Recommended Level of Care: [place] Prior Approval Number:    Date Approved/Denied: 11/06/19 PASRR Number: [id number]  Discharge Plan: SNF    Current Diagnoses: Patient Active Problem List   Diagnosis Date Noted  . Pneumonia of right upper lobe due to Streptococcus pneumoniae ([place]) 11/02/2019  . Common bile duct dilatation 11/02/2019  . Bilateral lower extremity edema 11/02/2019  . Severe protein-calorie malnutrition ([place]) 11/02/2019  . GERD without esophagitis 11/02/2019  . Mixed hyperlipidemia 11/02/2019  . SIRS (systemic inflammatory response syndrome) ([place]) 11/02/2019  . Acute metabolic encephalopathy [id number]  . AMS (altered mental status) 11/02/2019  . Generalized weakness 11/15/2013  . Confusion 11/15/2013  . CVA (cerebral infarction) 07/09/2013  . Slurred speech 07/08/2013  . Fall 07/08/2013  . Essential hypertension 07/08/2013  . Chronic pain syndrome 07/08/2013  . Hypokalemia 07/08/2013  . TIA (transient ischemic attack) 07/07/2013    Orientation RESPIRATION BLADDER Height & Weight     Self, Time, Situation, Place  Normal Continent Weight: 40.8 kg Height:  5\' 3"  (160 cm)  BEHAVIORAL SYMPTOMS/MOOD NEUROLOGICAL BOWEL NUTRITION STATUS      Incontinent Diet  AMBULATORY STATUS COMMUNICATION OF NEEDS Skin   Limited Assist Verbally Normal                       Personal Care Assistance Level of Assistance  Bathing, Dressing, Feeding Bathing  Assistance: Limited assistance Feeding assistance: Limited assistance Dressing Assistance: Limited assistance     Functional Limitations Info  Sight, Hearing, Speech Sight Info: Adequate Hearing Info: Adequate Speech Info: Adequate    SPECIAL CARE FACTORS FREQUENCY  PT (By licensed PT), OT (By licensed OT)     PT Frequency: PT at SNF to eval and treat a min of 5x/week OT Frequency: OT at SNF to eval and treat a min of 5x/week            Contractures Contractures Info: Not present    Additional Factors Info  Code Status, Allergies Code Status Info: DNR Allergies Info: plaquenil           Current Medications (11/06/2019):  This is the current hospital active medication list Current Facility-Administered Medications  Medication Dose Route Frequency Provider Last Rate Last Admin  . acetaminophen (TYLENOL) tablet 650 mg  650 mg Oral Q6H PRN Carol Ada, MD   650 mg at 11/05/19 2158   Or  . acetaminophen (TYLENOL) suppository 650 mg  650 mg Rectal Q6H PRN Carol Ada, MD      . amLODipine (NORVASC) tablet 10 mg  10 mg Oral Daily Carol Ada, MD   10 mg at 11/06/19 [id number]  . atorvastatin (LIPITOR) tablet 10 mg  10 mg Oral Daily Carol Ada, MD   10 mg at 11/06/19 [id number]  . clopidogrel (PLAVIX) tablet 75 mg  75 mg Oral Q breakfast Carol Ada, MD   75 mg at 11/06/19 [id number]  . colchicine tablet 0.3 mg  0.3 mg Oral Daily  Carol Ada, MD   0.3 mg at 11/06/19 0824  . docusate sodium (COLACE) capsule 100 mg  100 mg Oral BID Carol Ada, MD   100 mg at 11/06/19 [id number]  . enoxaparin (LOVENOX) injection 30 mg  30 mg Subcutaneous Daily Carol Ada, MD   30 mg at 11/06/19 0820  . famotidine (PEPCID) tablet 20 mg  20 mg Oral BID Carol Ada, MD   20 mg at 11/06/19 [id number]  . gabapentin (NEURONTIN) capsule 100 mg  100 mg Oral QHS Carol Ada, MD   100 mg at 11/05/19 2158  . mirtazapine (REMERON) tablet 30 mg  30 mg Oral QHS Carol Ada, MD   30 mg at 11/05/19 2158  . morphine 2  MG/ML injection 1 mg  1 mg Intravenous Q1H PRN Carol Ada, MD   1 mg at 11/04/19 1240  . ondansetron (ZOFRAN) tablet 4 mg  4 mg Oral Q6H PRN Carol Ada, MD       Or  . ondansetron [facility]) injection 4 mg  4 mg Intravenous Q6H PRN Carol Ada, MD      . polyethylene glycol (MIRALAX / GLYCOLAX) packet 17 g  17 g Oral Daily PRN Carol Ada, MD      . traMADol Veat[facility]) tablet 50 mg  50 mg Oral Q6H PRN Carol Ada, MD   50 mg at 11/04/19 [id number]     Discharge Medications: Please see discharge summary for a list of discharge medications.  Relevant Imaging Results:  Relevant Lab Results:   Additional Information SSN [id number]  Bartholomew Crews, RN

## 2019-11-06 NOTE — Progress Notes (Signed)
Physical Therapy Treatment Patient Details Name: Kim Blevins MRN: [id number] DOB: Jul 05, 1935 Today's Date: 11/06/2019    History of Present Illness 85 year old female presenting to ED by PCP due to lethargy, confusion, and persistent pneumonia. PMH of HTN, GERD, Lupus, stroke, probable Alzheimer's dementia severe protein calorie malnutrition and hyperlipidemia.    PT Comments    Continuing work on functional mobility and activity tolerance;  Significantly decr activity tolerance, reporting dizziness while sitting EOB, and moving to lay herself back down to supine; Attempted orthostatics, but unable to obtain sitting BP; At this point, we must consider SNF for rehab to maximize independence and safety with mobility; Dr. Cruzita Lederer is aware   Follow Up Recommendations  SNF     Equipment Recommendations  Other (comment)(pt has cane, RW, and BSC)    Recommendations for Other Services       Precautions / Restrictions Precautions Precautions: Fall Restrictions Weight Bearing Restrictions: No    Mobility  Bed Mobility Overal bed mobility: Needs Assistance Bed Mobility: Supine to Sit;Sit to Supine     Supine to sit: HOB elevated;Mod assist Sit to supine: Min guard(HOB down)   General bed mobility comments: Cues to initiate, mod assist to elevate trunk to sit and square off hips at EOB; after approx 60 seconds of sitting, pt reprots she needs to lay down  Transfers                 General transfer comment: Unable to stand due to patient states she is just too tired and needs to lay down (upon laying down pt had a large size  formed stool  Ambulation/Gait                 Stairs             Wheelchair Mobility    Modified Rankin (Stroke Patients Only)       Balance Overall balance assessment: Needs assistance Sitting-balance support: No upper extremity supported;Feet supported Sitting balance-Leahy Scale: Good                                       Cognition Arousal/Alertness: Awake/alert Behavior During Therapy: WFL for tasks assessed/performed Overall Cognitive Status: History of cognitive impairments - at baseline Area of Impairment: Safety/judgement;Memory;Awareness                     Memory: Decreased short-term memory   Safety/Judgement: Decreased awareness of safety;Decreased awareness of deficits            Exercises      General Comments General comments (skin integrity, edema, etc.): Attempted to obtain Orthostatic BPs due to her decr sitting tolerance today, unable to sit long enough for getting a sitting BP      Pertinent Vitals/Pain Pain Assessment: No/denies pain    Home Living                      Prior Function            PT Goals (current goals can now be found in the care plan section) Acute Rehab PT Goals Patient Stated Goal: to go home PT Goal Formulation: With patient/family Time For Goal Achievement: 11/17/19 Potential to Achieve Goals: Fair Progress towards PT goals: Not progressing toward goals - comment(limited sitting tolerance)    Frequency    Min 3X/week  PT Plan Discharge plan needs to be updated    Co-evaluation              AM-PAC PT "6 Clicks" Mobility   Outcome Measure  Help needed turning from your back to your side while in a flat bed without using bedrails?: A Little Help needed moving from lying on your back to sitting on the side of a flat bed without using bedrails?: A Little Help needed moving to and from a bed to a chair (including a wheelchair)?: A Little Help needed standing up from a chair using your arms (e.g., wheelchair or bedside chair)?: A Lot Help needed to walk in hospital room?: A Lot Help needed climbing 3-5 steps with a railing? : A Lot 6 Click Score: 15    End of Session Equipment Utilized During Treatment: Other (comment)(vitals machine) Activity Tolerance: Other (comment)(limited by decr  sitting/activity tolerance) Patient left: in bed;with call bell/phone within reach;with bed alarm set;Other (comment)(bed in semi-chair position) Nurse Communication: Mobility status PT Visit Diagnosis: Unsteadiness on feet (R26.81);Other abnormalities of gait and mobility (R26.89)     Time: 1432-1515(minus approx 7-8 minutes while pt on bedpan) PT Time Calculation (min) (ACUTE ONLY): 43 min  Charges:  $Therapeutic Activity: 23-37 mins                     Roney Marion, PT  Acute Rehabilitation Services Pager [phone] Office [place] 11/06/2019, 7:33 PM

## 2019-11-06 NOTE — TOC Initial Note (Signed)
Transition of Care [facility]) - Initial/Assessment Note    Patient Details  Name: Kim Blevins MRN: [id number] Date of Birth: Jul 26, 1934  Transition of Care [facility]) CM/SW Contact:    Bartholomew Crews, RN Phone Number: [phone] 11/06/2019, 4:25 PM  Clinical Narrative:                  Spoke with patient at the bedside. PTA was home with spouse. Patient provided verbal permission to speak with either of her sons, [name] or [place]. NCM advised that there was message for NCM to call her son. Spoke with Jonah Blue on the phone who is agreeable for patient to go for short term rehab stating that patient needs to be able to get up with her walker. John stated that he had already visited [facility], and they may have a bed on Friday. He provided a 3 rehab facilities for choice in order of preference: [facility], [facility], and [facility]. PASRR created and FL2 faxed to chosen SNFs. Will follow for bed offers. TOC team following for transition needs.   Expected Discharge Plan: Skilled Nursing Facility Barriers to Discharge: No SNF bed, Continued Medical Work up   Patient Goals and CMS Choice Patient states their goals for this hospitalization and ongoing recovery are:: return home with her husband CMS Medicare.gov Compare Post Acute Care list provided to:: Patient Choice offered to / list presented to : Adult Children, Patient  Expected Discharge Plan and Services Expected Discharge Plan: [place] In-house Referral: Clinical Social Work Discharge Planning Services: CM Consult Post Acute Care Choice: [place] arrangements for the past 2 months: Single Family Home                 DME Arranged: N/A DME Agency: NA       HH Arranged: NA [place] Agency: NA        Prior Living Arrangements/Services Living arrangements for the past 2 months: [place] with:: Self, Spouse Patient language and need for interpreter reviewed:: Yes Do you  feel safe going back to the place where you live?: Yes      Need for Family Participation in Patient Care: Yes (Comment) Care giver support system in place?: Yes (comment) Current home services: DME Criminal Activity/Legal Involvement Pertinent to Current Situation/Hospitalization: No - Comment as needed  Activities of Daily Living Home Assistive Devices/Equipment: Cane (specify quad or straight), Eyeglasses, Wheelchair, Environmental consultant (specify type) ADL Screening (condition at time of admission) Patient's cognitive ability adequate to safely complete daily activities?: Yes Is the patient deaf or have difficulty hearing?: No Does the patient have difficulty seeing, even when wearing glasses/contacts?: No Does the patient have difficulty concentrating, remembering, or making decisions?: Yes Patient able to express need for assistance with ADLs?: Yes Does the patient have difficulty dressing or bathing?: No Independently performs ADLs?: Yes (appropriate for developmental age) Does the patient have difficulty walking or climbing stairs?: Yes Weakness of Legs: Both Weakness of Arms/Hands: None  Permission Sought/Granted Permission sought to share information with : Family Supports Permission granted to share information with : Yes, Verbal Permission Granted  Share Information with NAME: Jonah Blue and [place] granted to share info w Relationship: sons     Emotional Assessment Appearance:: Appears stated age Attitude/Demeanor/Rapport: Engaged Affect (typically observed): Accepting Orientation: : Oriented to Self, Oriented to  Time, Oriented to Place, Oriented to Situation Alcohol / Substance Use: Not Applicable Psych Involvement: No (  comment)  Admission diagnosis:  Swelling [R60.9] Elevated liver function tests [R79.89] Thrombocytosis (HCC) [D47.3] Normochromic normocytic anemia [D64.9] Elevated random blood glucose level [R73.09] Pneumonia of right upper lobe due to  Streptococcus pneumoniae ([place]) [J13] Community acquired pneumonia of right upper lobe of lung [J18.9] AMS (altered mental status) [R41.82] Patient Active Problem List   Diagnosis Date Noted  . Pneumonia of right upper lobe due to Streptococcus pneumoniae ([place]) 11/02/2019  . Common bile duct dilatation 11/02/2019  . Bilateral lower extremity edema 11/02/2019  . Severe protein-calorie malnutrition ([place]) 11/02/2019  . GERD without esophagitis 11/02/2019  . Mixed hyperlipidemia 11/02/2019  . SIRS (systemic inflammatory response syndrome) ([place]) 11/02/2019  . Acute metabolic encephalopathy [id number]  . AMS (altered mental status) 11/02/2019  . Generalized weakness 11/15/2013  . Confusion 11/15/2013  . CVA (cerebral infarction) 07/09/2013  . Slurred speech 07/08/2013  . Fall 07/08/2013  . Essential hypertension 07/08/2013  . Chronic pain syndrome 07/08/2013  . Hypokalemia 07/08/2013  . TIA (transient ischemic attack) 07/07/2013   PCP:  Cathlean Sauer, MD Pharmacy:   [id number] [place], [place] [address] Phone: [phone] Fax: [place], [place] [address] Phone: [phone] Fax: [phone]  [id number] - [place],  - [address] AT [place] [address] [place] [address] Phone: [phone] Fax: [phone]     Social Determinants of Health (SDOH) Interventions    Readmission Risk Interventions No flowsheet data found.

## 2019-11-06 NOTE — Progress Notes (Addendum)
Occupational Therapy Treatment Patient Details Name: Kim Blevins MRN: [id number] DOB: 1934/09/04 Today's Date: 11/06/2019    History of present illness 85 year old female presenting to ED by PCP due to lethargy, confusion, and persistent pneumonia. PMH of HTN, GERD, Lupus, stroke, probable Alzheimer's dementia severe protein calorie malnutrition and hyperlipidemia.   OT comments  This 85 yo female admitted with above seen today for basic ADLs. Pt laying in bed with bed in chair position (I had spoken to PT before I entered room. With PT, pt reported she was dizzy upon sitting up and PT was unable to get her BP in sitting x2, pt with bowel movement with PT). I was able to get 2 of 3 orthostatics supine (HOB 50 degrees with bed in chair position) 152/82 HR 97; sitting EOB 112/48 HR 97. Pt reported being really tired but not dizzy with me despite the significant drop in BP--"i'm just tired and need to lay down". Pt returned to supine and pt with bowel movement. Pt cleaned up at bed level. Feel that because pt is progressing slowly she would benefit from SNF stay prior to D/C home.   Follow Up Recommendations  SNF;Supervision/Assistance - 24 hour(Pt progressing slowly,weak)    Equipment Recommendations  Other (comment)(TBD at next venue)       Precautions / Restrictions Precautions Precautions: Fall Restrictions Weight Bearing Restrictions: No       Mobility Bed Mobility Overal bed mobility: Needs Assistance Bed Mobility: Supine to Sit;Sit to Supine     Supine to sit: Min guard;HOB elevated Sit to supine: Min guard(HOB down)       Balance Overall balance assessment: Needs assistance Sitting-balance support: No upper extremity supported;Feet supported Sitting balance-Leahy Scale: Good                                     ADL either performed or assessed with clinical judgement         Vision Baseline Vision/History: Wears glasses Wears Glasses: At all  times Patient Visual Report: No change from baseline            Cognition Arousal/Alertness: Awake/alert Behavior During Therapy: WFL for tasks assessed/performed Overall Cognitive Status: History of cognitive impairments - at baseline                       Memory: Decreased short-term memory   Safety/Judgement: Decreased awareness of safety;Decreased awareness of deficits                         Pertinent Vitals/ Pain       Pain Assessment: No/denies pain         Frequency  Min 2X/week        Progress Toward Goals  OT Goals(current goals can now be found in the care plan section)  Progress towards OT goals: Not progressing toward goals - comment(weak and tired today, bowel movements with both myself and with PT eariler today)  Acute Rehab OT Goals Patient Stated Goal: to go home OT Goal Formulation: With patient/family Time For Goal Achievement: 11/17/19 Potential to Achieve Goals: [place] Discharge plan needs to be updated       AM-PAC OT "6 Clicks" Daily Activity     Outcome Measure   Help from another person eating meals?: A Little Help from another person taking care of personal grooming?:  A Little Help from another person toileting, which includes using toliet, bedpan, or urinal?: A Lot Help from another person bathing (including washing, rinsing, drying)?: A Lot Help from another person to put on and taking off regular upper body clothing?: A Little Help from another person to put on and taking off regular lower body clothing?: A Lot 6 Click Score: 15    End of Session Equipment Utilized During Treatment: (AE)  OT Visit Diagnosis: Other abnormalities of gait and mobility (R26.89);Muscle weakness (generalized) (M62.81)   Activity Tolerance Patient limited by fatigue   Patient Left in bed;with call bell/phone within reach;with bed alarm set(bed in lowest position and fall mats on both sides of bed)           Time:  [id number] OT Time Calculation (min): 26 min  Charges: OT General Charges $OT Visit: 1 Visit OT Treatments $Self Care/Home Management : 23-37 mins   [place]en Circle, OTR/L Acute [employer] Pager [phone] Office [phone]      Almon Register 11/06/2019, 5:00 PM

## 2019-11-07 ENCOUNTER — Inpatient Hospital Stay (HOSPITAL_COMMUNITY): Payer: Medicare Other

## 2019-11-07 LAB — URINALYSIS, ROUTINE W REFLEX MICROSCOPIC
Bilirubin Urine: NEGATIVE
Glucose, UA: NEGATIVE mg/dL
Hgb urine dipstick: NEGATIVE
Ketones, ur: NEGATIVE mg/dL
Nitrite: NEGATIVE
Protein, ur: NEGATIVE mg/dL
Specific Gravity, Urine: 1.021 (ref 1.005–1.030)
pH: 6 (ref 5.0–8.0)

## 2019-11-07 LAB — CULTURE, BLOOD (ROUTINE X 2)
Culture: NO GROWTH
Culture: NO GROWTH
Special Requests: ADEQUATE

## 2019-11-07 LAB — HEPATIC FUNCTION PANEL
ALT: 37 U/L (ref 0–44)
AST: 31 U/L (ref 15–41)
Albumin: 2.5 g/dL — ABNORMAL LOW (ref 3.5–5.0)
Alkaline Phosphatase: 81 U/L (ref 38–126)
Bilirubin, Direct: 0.1 mg/dL (ref 0.0–0.2)
Indirect Bilirubin: 0.2 mg/dL — ABNORMAL LOW (ref 0.3–0.9)
Total Bilirubin: 0.3 mg/dL (ref 0.3–1.2)
Total Protein: 5.6 g/dL — ABNORMAL LOW (ref 6.5–8.1)

## 2019-11-07 LAB — CBC
HCT: 33.1 % — ABNORMAL LOW (ref 36.0–46.0)
Hemoglobin: 10.8 g/dL — ABNORMAL LOW (ref 12.0–15.0)
MCH: 30.1 pg (ref 26.0–34.0)
MCHC: 32.6 g/dL (ref 30.0–36.0)
MCV: 92.2 fL (ref 80.0–100.0)
Platelets: 443 10*3/uL — ABNORMAL HIGH (ref 150–400)
RBC: 3.59 MIL/uL — ABNORMAL LOW (ref 3.87–5.11)
RDW: 15 % (ref 11.5–15.5)
WBC: 19.8 10*3/uL — ABNORMAL HIGH (ref 4.0–10.5)
nRBC: 0 % (ref 0.0–0.2)

## 2019-11-07 LAB — BASIC METABOLIC PANEL
Anion gap: 13 (ref 5–15)
BUN: 30 mg/dL — ABNORMAL HIGH (ref 8–23)
CO2: 25 mmol/L (ref 22–32)
Calcium: 8.8 mg/dL — ABNORMAL LOW (ref 8.9–10.3)
Chloride: 100 mmol/L (ref 98–111)
Creatinine, Ser: 0.63 mg/dL (ref 0.44–1.00)
GFR calc Af Amer: >60 mL/min (ref >60)
GFR calc non Af Amer: >60 mL/min (ref >60)
Glucose, Bld: 122 mg/dL — ABNORMAL HIGH (ref 70–99)
Potassium: 3.3 mmol/L — ABNORMAL LOW (ref 3.5–5.1)
Sodium: 138 mmol/L (ref 135–145)

## 2019-11-07 MED ORDER — SODIUM CHLORIDE 0.9 % IV SOLN: INTRAVENOUS | Status: AC

## 2019-11-07 MED ORDER — POTASSIUM CHLORIDE 20 MEQ/15ML (10%) PO SOLN
40.0000 meq | Freq: Once | ORAL | Status: AC
  Administered 2019-11-07: 40 meq via ORAL
  Filled 2019-11-07: qty 30

## 2019-11-07 NOTE — Progress Notes (Signed)
Physical Therapy Treatment Patient Details Name: Kim Blevins MRN: [id number] DOB: 1935-04-21 Today's Date: 11/07/2019    History of Present Illness 85 year old female presenting to ED by PCP due to lethargy, confusion, and persistent pneumonia. PMH of HTN, GERD, Lupus, stroke, probable Alzheimer's dementia severe protein calorie malnutrition and hyperlipidemia.    PT Comments    Continuing work on functional mobility and activity tolerance;  Session focused on activity tolerance, assessing orhtostatic BPs; Able to sit up longer than yesterday before reporting lightheadedness; Still, noting substantial SBP drop between supine and sitting, with concurrent request to lie down    11/07/19 0936  Orthostatic Lying   BP- Lying 157/84  Pulse- Lying 58  Orthostatic Sitting  BP- Sitting 105/69  Pulse- Sitting 69  Orthostatic Standing at 0 minutes  BP- Standing at 0 minutes  (Unable to obtain)     Follow Up Recommendations  SNF     Equipment Recommendations  Other (comment)(pt has cane, RW, and BSC)    Recommendations for Other Services       Precautions / Restrictions Precautions Precautions: Fall Precaution Comments: Orthostatic     Mobility  Bed Mobility Overal bed mobility: Needs Assistance Bed Mobility: Supine to Sit;Sit to Supine     Supine to sit: HOB elevated;Mod assist Sit to supine: Min guard   General bed mobility comments: Cues to initiate, mod assist to elevate trunk to sit and square off hips at EOB; after approx 60 seconds of sitting, pt reprots she needs to lay down  Transfers                 General transfer comment: decr tolerance of upright postures  Ambulation/Gait                 Stairs             Wheelchair Mobility    Modified Rankin (Stroke Patients Only)       Balance     Sitting balance-Leahy Scale: Fair                                      Cognition Arousal/Alertness:  Awake/alert Behavior During Therapy: WFL for tasks assessed/performed;Restless(wants very badly to go home) Overall Cognitive Status: History of cognitive impairments - at baseline Area of Impairment: Safety/judgement;Memory;Awareness                     Memory: Decreased short-term memory   Safety/Judgement: Decreased awareness of safety;Decreased awareness of deficits            Exercises      General Comments        Pertinent Vitals/Pain Pain Assessment: No/denies pain    Home Living                      Prior Function            PT Goals (current goals can now be found in the care plan section) Acute Rehab PT Goals Patient Stated Goal: to go home PT Goal Formulation: With patient Time For Goal Achievement: 11/17/19 Potential to Achieve Goals: Fair Progress towards PT goals: Progressing toward goals(thoguh still limited sitting tolerance)    Frequency    Min 3X/week      PT Plan Current plan remains appropriate    Co-evaluation  AM-PAC PT "6 Clicks" Mobility   Outcome Measure  Help needed turning from your back to your side while in a flat bed without using bedrails?: A Little Help needed moving from lying on your back to sitting on the side of a flat bed without using bedrails?: A Little Help needed moving to and from a bed to a chair (including a wheelchair)?: A Little Help needed standing up from a chair using your arms (e.g., wheelchair or bedside chair)?: A Lot Help needed to walk in hospital room?: A Lot Help needed climbing 3-5 steps with a railing? : A Lot 6 Click Score: 15    End of Session Equipment Utilized During Treatment: Other (comment)(vitals machine) Activity Tolerance: Other (comment)(limited by decr sitting/activity tolerance) Patient left: in bed;with call bell/phone within reach;with bed alarm set;Other (comment)(bed in semi-chair position) Nurse Communication: Mobility status PT Visit  Diagnosis: Unsteadiness on feet (R26.81);Other abnormalities of gait and mobility (R26.89)     Time: [id number] PT Time Calculation (min) (ACUTE ONLY): 22 min  Charges:  $Therapeutic Activity: 8-22 mins                     Roney Marion, PT  Acute Rehabilitation Services Pager [phone] Office [phone]    [name] 11/07/2019, 4:14 PM

## 2019-11-07 NOTE — Progress Notes (Signed)
PROGRESS NOTE  Kim Blevins [id number] DOB: [birth year] DOA: 11/01/2019 PCP: Cathlean Sauer, MD   LOS: 5 days   Brief Narrative / Interim history: 85 year old female with HTN, GERD, protein calorie malnutrition, probable Alzheimer's dementia, was directed to the hospital by PCP. She was hospitalized at [facility] from 4/2-4/5 with pneumonia, was treated with antibiotics and then released home.  Husband tells me that since she has been home she has been having progressive lethargy, sleepiness, as well as increased confusion.  He also reports that she has been having increasing left ankle swelling and pain over the last 4 days.  They saw the PCP the day prior to admission and apparently she was febrile, and with leukocytosis and lethargy the PCP was concerned about an ongoing infectious process and she was directed to the emergency room.  She also has elevated LFTs and CBD dilation on ultrasound and an MRCP has been ordered as an outpatient.  She had elevated bilirubin here up to 2.4, an MRCP was done inpatient and showed a CBD filling defect, GI consulted and underwent an ERCP on 4/18 without any significant findings.  She has been having persistent weakness, leukocytosis, physical therapy recommended SNF.  Subjective / 24h Interval events: Crying, wants to go home and begging me to let her go, worked with PT this morning and became quite lightheaded.  Denies any chest pain, denies any shortness of breath, denies any cough or chest congestion.  No abdominal pain, no nausea or vomiting.  Assessment & Plan: Principal Problem Sepsis due to lobar pneumonia /recurrent leukocytosis-patient met criteria with tachycardia, tachypnea as well as elevated WBC, she was started on ceftriaxone and azithromycin and seems to be improving.  She finished 5 days of antibiotics on 4/18, however over the last couple of days her WBC are increasing now up to 19 K.  She has no respiratory symptoms, she is afebrile.  Will  obtain a repeat chest x-ray, urinalysis and monitor clinically -I was able to discuss over the phone with patient's PCP Dr. Lorne Skeens yesterday, and she tells me that the patient's husband has been having issues with recurrent pneumonia and eventually sputum cultures grew Bordetella bronchiseptica which required patient to have IV Carbapenem. -Attempt to obtain sputum culture for her as well however without respiratory symptoms will be difficult.  BAL not necessarily indicated right now without any symptoms and being on room air.  Active Problems Elevated LFTs with CBD dilation-patient does not have any GI symptoms however she has poor recollection whether she had GI symptoms at home.  Her AST, ALT are elevated but stable, however total bilirubin increased from 0.8 on admission to 2.4 on 4/16.  She underwent an MRI/MRCP which showed possible filling defect in the CBD.  Consulted gastroenterology.  She underwent ERCP which was negative. Given positive MRCP and CBD dilation on ultrasound is possible that she may have passed a small stone. Tolerating diet. Repeat LFTs this morning within normal limits  Disposition - patient exhibiting significant weakness. D/w son over the phone who expresses concerns regarding home safety given weakness. PT re-evaluated, recommending now SNF.  She is also quite orthostatic this morning by numbers as well as becoming very dizzy upon standing, will resume IV fluids for today.  Encourage p.o. intake  Left ankle swelling- started on colchicine, suspect gout.  X-ray without acute findings.  Her BNP was elevated on admission underwent a 2D echo which was essentially unremarkable with normal ejection fraction.  Resolved, no swelling/erythema present  Essential hypertension-continue Norvasc, she is hypertensive laying down  Hyperlipidemia-continue statin  Irregular heart rate-this is sinus arrhythmia with presence of different morphology P waves, no evidence of A. fib  currently  Severe protein calorie malnutrition-chronic, dietary consult  History of TIA-she is on Plavix, continue  Alzheimer's dementia-husband tells me she was told this about 3 years ago but appreciates that her memory problems are mild.  Scheduled Meds: . amLODipine  10 mg Oral Daily  . atorvastatin  10 mg Oral Daily  . clopidogrel  75 mg Oral Q breakfast  . colchicine  0.3 mg Oral Daily  . docusate sodium  100 mg Oral BID  . enoxaparin (LOVENOX) injection  30 mg Subcutaneous Daily  . famotidine  20 mg Oral BID  . gabapentin  100 mg Oral QHS  . mirtazapine  30 mg Oral QHS  . potassium chloride  40 mEq Oral Once   Continuous Infusions: . sodium chloride     PRN Meds:.acetaminophen **OR** acetaminophen, morphine injection, ondansetron **OR** ondansetron (ZOFRAN) IV, polyethylene glycol, traMADol  DVT prophylaxis: Lovenox Code Status: DNR Family Communication: Discussed with son over the phone 4/20 Patient admitted from: Home Anticipated d/c place: To be determined, physical therapy evaluation pending Barriers to d/c: Increasing WBC, placement pending  Consultants:  None  Procedures:  2D echo:  IMPRESSIONS  1. Left ventricular ejection fraction, by estimation, is 60 to 65%. The left ventricle has normal function. The left ventricle has no regional wall motion abnormalities. Left ventricular diastolic function could not be evaluated.  2. Right ventricular systolic function is normal. The right ventricular size is normal.  3. The mitral valve is normal in structure. Trivial mitral valve regurgitation. No evidence of mitral stenosis.  4. The aortic valve is normal in structure. Aortic valve regurgitation is not visualized. No aortic stenosis is present.  5. The inferior vena cava is normal in size with greater than 50% respiratory variability, suggesting right atrial pressure of 3 mmHg.   Microbiology  None   Antimicrobials: Ceftriaxone / Azithromycin 4/15 >>  4/19   Objective: Vitals:   11/06/19 1630 11/06/19 1640 11/06/19 2233 11/07/19 0425  BP: 138/70  (!) 160/109 (!) 158/94  Pulse: 79  (!) 56 (!) 103  Resp: '18  18 20  ' Temp:   98.5 F (36.9 C) 98.3 F (36.8 C)  TempSrc:   Oral Oral  SpO2: 96% 95% 97% 97%  Weight:      Height:        Intake/Output Summary (Last 24 hours) at 11/07/2019 1000 Last data filed at 11/07/2019 0600 Gross per 24 hour  Intake 240 ml  Output 900 ml  Net -660 ml   Filed Weights   11/01/19 2310 11/04/19 2108 11/05/19 2147  Weight: 47.6 kg 45.4 kg 40.8 kg    Examination:  Constitutional: No distress, cachectic appearing Eyes: No scleral icterus ENMT: Moist mucous membranes Neck: normal, supple Respiratory: Clear bilaterally, no wheezing, no crackles Cardiovascular: Regular rate and rhythm, tachycardic, no edema Abdomen: Soft, nontender, nondistended, bowel sounds positive Musculoskeletal: no clubbing / cyanosis.  Skin: No rashes appreciated Neurologic: Generalized weakness present but no focal deficits  Data Reviewed: I have independently reviewed following labs and imaging studies   CBC: Recent Labs  Lab 11/01/19 2340 11/01/19 2340 11/02/19 0530 11/02/19 0530 11/03/19 0644 11/04/19 0716 11/05/19 0529 11/06/19 0457 11/07/19 0328  WBC 12.2*   < > 11.0*   < > 9.8 8.3 8.0 14.4* 19.8*  NEUTROABS 10.6*  --  8.2*  --  6.1  --   --   --   --   HGB 11.4*   < > 10.6*   < > 11.8* 11.3* 11.4* 10.7* 10.8*  HCT 36.9   < > 34.0*   < > 38.1 35.8* 36.7 33.4* 33.1*  MCV 95.6   < > 95.8   < > 93.6 92.7 93.1 92.0 92.2  PLT 444*   < > 399   < > 442* 484* 442* 542* 443*   < > = values in this interval not displayed.   Basic Metabolic Panel: Recent Labs  Lab 11/02/19 0530 11/02/19 0530 11/03/19 0644 11/04/19 0716 11/05/19 0529 11/06/19 0457 11/07/19 0328  NA 138   < > 138 139 141 140 138  K 3.9   < > 5.3* 3.0* 3.4* 3.3* 3.3*  CL 99   < > 96* 97* 101 99 100  CO2 26   < > '24 27 29 28 25  ' GLUCOSE  147*   < > 96 111* 106* 134* 122*  BUN 16   < > '11 12 13 ' 24* 30*  CREATININE 0.70   < > 0.61 0.60 0.62 0.73 0.63  CALCIUM 8.8*   < > 8.8* 8.9 9.2 9.4 8.8*  MG  --   --  1.8  --   --   --   --   PHOS 2.8  --   --   --   --   --   --    < > = values in this interval not displayed.   Liver Function Tests: Recent Labs  Lab 11/01/19 2340 11/02/19 0530 11/03/19 0644 11/04/19 0716 11/05/19 0529  AST 185*  --  160* 98* 56*  ALT 87*  --  88* 79* 62*  ALKPHOS 135*  --  122 111 107  BILITOT 0.8  --  2.4* 0.5 0.4  PROT 6.5  --  6.0* 6.0* 6.1*  ALBUMIN 2.3* 2.1* 2.3* 2.4* 2.3*   Coagulation Profile: Recent Labs  Lab 11/01/19 2340  INR 1.1   HbA1C: No results for input(s): HGBA1C in the last 72 hours. CBG: No results for input(s): GLUCAP in the last 168 hours.  Recent Results (from the past 240 hour(s))  Blood Culture (routine x 2)     Status: None (Preliminary result)   Collection Time: 11/01/19 11:40 PM   Specimen: BLOOD  Result Value Ref Range Status   Specimen Description BLOOD SITE NOT SPECIFIED  Final   Special Requests   Final    BOTTLES DRAWN AEROBIC AND ANAEROBIC Blood Culture results may not be optimal due to an inadequate volume of blood received in culture bottles   Culture   Final    NO GROWTH 4 DAYS Performed at [facility], [place] [address], [place], [place] [address]    Report Status PENDING  Incomplete  Blood Culture (routine x 2)     Status: None (Preliminary result)   Collection Time: 11/01/19 11:48 PM   Specimen: BLOOD  Result Value Ref Range Status   Specimen Description BLOOD SITE NOT SPECIFIED  Final   Special Requests   Final    BOTTLES DRAWN AEROBIC AND ANAEROBIC Blood Culture adequate volume   Culture   Final    NO GROWTH 4 DAYS Performed at [facility], [address], [place], [place] [address]    Report Status PENDING  Incomplete  SARS CORONAVIRUS 2 (TAT 6-24 HRS) Nasopharyngeal Nasopharyngeal Swab     Status: None  Collection  Time: 11/02/19 12:52 AM   Specimen: Nasopharyngeal Swab  Result Value Ref Range Status   SARS Coronavirus 2 NEGATIVE NEGATIVE Final    Comment: (NOTE) SARS-CoV-2 target nucleic acids are NOT DETECTED. The SARS-CoV-2 RNA is generally detectable in upper and lower respiratory specimens during the acute phase of infection. Negative results do not preclude SARS-CoV-2 infection, do not rule out co-infections with other pathogens, and should not be used as the sole basis for treatment or other patient management decisions. Negative results must be combined with clinical observations, patient history, and epidemiological information. The expected result is Negative. Fact Sheet for Patients: SugarRoll.be Fact Sheet for Healthcare Providers: https://www.woods-mathews.com/ This test is not yet approved or cleared by the [place] FDA and  has been authorized for detection and/or diagnosis of SARS-CoV-2 by FDA under an Emergency Use Authorization (EUA). This EUA will remain  in effect (meaning this test can be used) for the duration of the COVID-19 declaration under Section 56 4(b)(1) of the Act, 21 U.S.C. section 360bbb-3(b)(1), unless the authorization is terminated or revoked sooner. Performed at [facility], [place] [address] [place], [place] [address]   Urine culture     Status: Abnormal   Collection Time: 11/02/19  2:00 AM   Specimen: In/Out Cath Urine  Result Value Ref Range Status   Specimen Description IN/OUT CATH URINE  Final   Special Requests   Final    NONE Performed at [facility], [place] [address], [place], [place] [address]    Culture MULTIPLE SPECIES PRESENT, SUGGEST RECOLLECTION (A)  Final   Report Status 11/02/2019 FINAL  Final     Radiology Studies: No results found.  Marzetta Board, MD, PhD [facility]  Between 7 am - 7 pm I am available, please contact me via Amion or Securechat  Between 7 pm - 7  am I am not available, please contact night coverage MD/APP via Amion

## 2019-11-07 NOTE — TOC Progression Note (Signed)
Transition of Care [facility]) - Progression Note    Patient Details  Name: Kim Blevins MRN: [id number] Date of Birth: [birth year]  Transition of Care [facility]) CM/SW Contact  Bartholomew Crews, RN Phone Number: [phone] 11/07/2019, 1:19 PM  Clinical Narrative:     Received call from [place] in admissions at [facility] to offer bed to patient when medically stable. Spoke with patient's son, [name], who accepted bed offer. Cheri aware. Patient will need covid test. TOC following for transition needs.   Expected Discharge Plan: [place] Barriers to Discharge: No SNF bed, Continued Medical Work up  Expected Discharge Plan and Services Expected Discharge Plan: [place] In-house Referral: Clinical Social Work Discharge Planning Services: CM Consult Post Acute Care Choice: [place] arrangements for the past 2 months: Single Family Home                 DME Arranged: N/A DME Agency: NA       HH Arranged: NA HH Agency: NA         Social Determinants of Health (SDOH) Interventions    Readmission Risk Interventions No flowsheet data found.

## 2019-11-07 NOTE — Plan of Care (Signed)
  Problem: Education: Goal: Knowledge of General Education information will improve Description: Including pain rating scale, medication(s)/side effects and non-pharmacologic comfort measures Outcome: Progressing   Problem: Activity: Goal: Risk for activity intolerance will decrease Outcome: Progressing   

## 2019-11-08 DIAGNOSIS — Z66 Do not resuscitate: Secondary | ICD-10-CM | POA: Diagnosis present

## 2019-11-08 LAB — COMPREHENSIVE METABOLIC PANEL
ALT: 33 U/L (ref 0–44)
AST: 31 U/L (ref 15–41)
Albumin: 2.5 g/dL — ABNORMAL LOW (ref 3.5–5.0)
Alkaline Phosphatase: 79 U/L (ref 38–126)
Anion gap: 7 (ref 5–15)
BUN: 18 mg/dL (ref 8–23)
CO2: 26 mmol/L (ref 22–32)
Calcium: 8.8 mg/dL — ABNORMAL LOW (ref 8.9–10.3)
Chloride: 106 mmol/L (ref 98–111)
Creatinine, Ser: 0.61 mg/dL (ref 0.44–1.00)
GFR calc Af Amer: >60 mL/min (ref >60)
GFR calc non Af Amer: >60 mL/min (ref >60)
Glucose, Bld: 105 mg/dL — ABNORMAL HIGH (ref 70–99)
Potassium: 3.2 mmol/L — ABNORMAL LOW (ref 3.5–5.1)
Sodium: 139 mmol/L (ref 135–145)
Total Bilirubin: 0.4 mg/dL (ref 0.3–1.2)
Total Protein: 5.4 g/dL — ABNORMAL LOW (ref 6.5–8.1)

## 2019-11-08 LAB — MAGNESIUM: Magnesium: 1.7 mg/dL (ref 1.7–2.4)

## 2019-11-08 LAB — URINE CULTURE: Culture: NO GROWTH

## 2019-11-08 LAB — CBC
HCT: 30.8 % — ABNORMAL LOW (ref 36.0–46.0)
Hemoglobin: 9.6 g/dL — ABNORMAL LOW (ref 12.0–15.0)
MCH: 29.4 pg (ref 26.0–34.0)
MCHC: 31.2 g/dL (ref 30.0–36.0)
MCV: 94.5 fL (ref 80.0–100.0)
Platelets: 374 10*3/uL (ref 150–400)
RBC: 3.26 MIL/uL — ABNORMAL LOW (ref 3.87–5.11)
RDW: 15.9 % — ABNORMAL HIGH (ref 11.5–15.5)
WBC: 12.7 10*3/uL — ABNORMAL HIGH (ref 4.0–10.5)
nRBC: 0 % (ref 0.0–0.2)

## 2019-11-08 LAB — PHOSPHORUS: Phosphorus: 2.6 mg/dL (ref 2.5–4.6)

## 2019-11-08 LAB — SARS CORONAVIRUS 2 (TAT 6-24 HRS): SARS Coronavirus 2: NEGATIVE

## 2019-11-08 MED ORDER — MEGESTROL ACETATE 20 MG PO TABS
20.0000 mg | ORAL_TABLET | Freq: Every day | ORAL | Status: DC
  Administered 2019-11-08 – 2019-11-09 (×2): 20 mg via ORAL
  Filled 2019-11-08 (×2): qty 1

## 2019-11-08 MED ORDER — POTASSIUM CHLORIDE CRYS ER 20 MEQ PO TBCR
40.0000 meq | EXTENDED_RELEASE_TABLET | Freq: Once | ORAL | Status: AC
  Administered 2019-11-08: 40 meq via ORAL
  Filled 2019-11-08: qty 2

## 2019-11-08 NOTE — Progress Notes (Signed)
Progress Note    Kim Blevins  GSU:[id number] DOB: 1934-10-18  DOA: 11/01/2019 PCP: Cathlean Sauer, MD    Brief Narrative:    Medical records reviewed and are as summarized below:  Kim Blevins is an 85 y.o. female with HTN, GERD, protein calorie malnutrition, probable Alzheimer's dementia, was directed to the hospital by PCP. She was hospitalized at [facility] from 4/2-4/5 with pneumonia, was treated with antibiotics and then released home. Husband tells me that since she has been home she has been having progressive lethargy, sleepiness, as well as increased confusion. He also reports that she has been having increasing left ankle swelling and pain over the last 4 days. They saw the PCP the day prior to admission and apparently she was febrile, and with leukocytosis and lethargy the PCP was concerned about an ongoing infectious process and she was directed to the emergency room.  She also has elevated LFTs and CBD dilation on ultrasound and an MRCP has been ordered as an outpatient.  She had elevated bilirubin here up to 2.4, an MRCP was done inpatient and showed a CBD filling defect, GI consulted and underwent an ERCP on 4/18 without any significant findings.  She has been having persistent weakness, leukocytosis, physical therapy recommended SNF.  Assessment/Plan:   Principal Problem:   Pneumonia of right upper lobe due to Streptococcus pneumoniae [facility]) Active Problems:   Essential hypertension   Common bile duct dilatation   Bilateral lower extremity edema   Severe protein-calorie malnutrition (HCC)   GERD without esophagitis   Mixed hyperlipidemia   SIRS (systemic inflammatory response syndrome) (HCC)   Acute metabolic encephalopathy   AMS (altered mental status)   DNR (do not resuscitate)   Sepsis due to lobar pneumonia /recurrent leukocytosis-present on admission -patient met criteria with tachycardia, tachypnea as well as elevated WBC, she was started on  ceftriaxone and azithromycin and improved  - finished 5 days of antibiotics on 4/18 -over the last couple of days her WBC increased up to 19 K.  She has no respiratory symptoms, she is afebrile.   -WBCs are trending down, so suspect reactive  Elevated LFTs with CBD dilation-patient does not have any GI symptoms however she has poor recollection whether she had GI symptoms at home.  Her AST, ALT are elevated but stable, however total bilirubin increased from 0.8 on admission to 2.4 on 4/16.  She underwent an MRI/MRCP which showed possible filling defect in the CBD.  -gastroenterology consult -ERCP was negative. Given positive MRCP and CBD dilation on ultrasound is possible that she may have passed a small stone. -Resolved  Left ankle swelling- - Resolved, no swelling/erythema present  Essential hypertension -continue Norvasc, she is hypertensive laying down -If patient continues to have orthostatic issues may need to allow for permissive supine hypertension  Hyperlipidemia -continue statin  Hypokalemia -Replete   Irregular heart rate -sinus arrhythmia with presence of different morphology P waves, no evidence of A. fib currently  Severe protein calorie malnutrition -Encourage p.o. intake  History of TIA -Continue Plavix  Alzheimer's dementia- -Per family memory problems are mild.  Would recommend palliative care to follow patient at the rehab facility  Family Communication/Anticipated D/C date and plan/Code Status   DVT prophylaxis: Lovenox ordered. Code Status: DNR Disposition Plan: Status is: Inpatient  Remains inpatient appropriate because:Unsafe d/c plan   Dispo: The patient is from: Home              Anticipated d/c is to:  SNF              Anticipated d/c date is: 1 day              Patient currently is not medically stable to d/c.  Suspect patient will be ready to be DC'd in the a.m. to skilled nursing facility          Medical Consultants:     GI     Subjective:   Patient sleepy but denies any pain  Objective:    Vitals:   11/07/19 1634 11/07/19 2100 11/08/19 0753 11/08/19 1000  BP: 139/67 (!) 158/71 (!) 130/58   Pulse: 73 100 100   Resp: 14  16   Temp: 98.2 F (36.8 C) 98.4 F (36.9 C) 98 F (36.7 C)   TempSrc: Oral Oral Oral   SpO2: 95% 97% 97% 97%  Weight:      Height:        Intake/Output Summary (Last 24 hours) at 11/08/2019 1110 Last data filed at 11/08/2019 0947 Gross per 24 hour  Intake 640 ml  Output 440 ml  Net 200 ml   Filed Weights   11/01/19 2310 11/04/19 2108 11/05/19 2147  Weight: 47.6 kg 45.4 kg 40.8 kg    Exam: In bed, chronically ill-appearing Regular rate and rhythm No increased work of breathing Minimal lower extremity edema Positive bowel sounds, soft, nontender Oriented to person but very sleepy  Data Reviewed:   I have personally reviewed following labs and imaging studies:  Labs: Labs show the following:   Basic Metabolic Panel: Recent Labs  Lab 11/02/19 0530 11/02/19 0530 11/03/19 0644 11/03/19 0644 11/04/19 0716 11/04/19 0716 11/05/19 0529 11/05/19 0529 11/06/19 0457 11/06/19 0457 11/07/19 0328 11/08/19 0356  NA 138   < > 138   < > 139  --  141  --  140  --  138 139  K 3.9   < > 5.3*   < > 3.0*   < > 3.4*   < > 3.3*   < > 3.3* 3.2*  CL 99   < > 96*   < > 97*  --  101  --  99  --  100 106  CO2 26   < > 24   < > 27  --  29  --  28  --  25 26  GLUCOSE 147*   < > 96   < > 111*  --  106*  --  134*  --  122* 105*  BUN 16   < > 11   < > 12  --  13  --  24*  --  30* 18  CREATININE 0.70   < > 0.61   < > 0.60  --  0.62  --  0.73  --  0.63 0.61  CALCIUM 8.8*   < > 8.8*   < > 8.9  --  9.2  --  9.4  --  8.8* 8.8*  MG  --   --  1.8  --   --   --   --   --   --   --   --  1.7  PHOS 2.8  --   --   --   --   --   --   --   --   --   --  2.6   < > = values in this interval not displayed.   GFR Estimated Creatinine Clearance: 34.9 mL/min (by C-G formula  based on  SCr of 0.61 mg/dL). Liver Function Tests: Recent Labs  Lab 11/03/19 0644 11/04/19 0716 11/05/19 0529 11/07/19 0328 11/08/19 0356  AST 160* 98* 56* 31 31  ALT 88* 79* 62* 37 33  ALKPHOS 122 111 107 81 79  BILITOT 2.4* 0.5 0.4 0.3 0.4  PROT 6.0* 6.0* 6.1* 5.6* 5.4*  ALBUMIN 2.3* 2.4* 2.3* 2.5* 2.5*   No results for input(s): LIPASE, AMYLASE in the last 168 hours. No results for input(s): AMMONIA in the last 168 hours. Coagulation profile Recent Labs  Lab 11/01/19 2340  INR 1.1    CBC: Recent Labs  Lab 11/01/19 2340 11/01/19 2340 11/02/19 0530 11/02/19 0530 11/03/19 0644 11/03/19 0644 11/04/19 0716 11/05/19 0529 11/06/19 0457 11/07/19 0328 11/08/19 0356  WBC 12.2*   < > 11.0*   < > 9.8   < > 8.3 8.0 14.4* 19.8* 12.7*  NEUTROABS 10.6*  --  8.2*  --  6.1  --   --   --   --   --   --   HGB 11.4*   < > 10.6*   < > 11.8*   < > 11.3* 11.4* 10.7* 10.8* 9.6*  HCT 36.9   < > 34.0*   < > 38.1   < > 35.8* 36.7 33.4* 33.1* 30.8*  MCV 95.6   < > 95.8   < > 93.6   < > 92.7 93.1 92.0 92.2 94.5  PLT 444*   < > 399   < > 442*   < > 484* 442* 542* 443* 374   < > = values in this interval not displayed.   Cardiac Enzymes: No results for input(s): CKTOTAL, CKMB, CKMBINDEX, TROPONINI in the last 168 hours. BNP (last 3 results) No results for input(s): PROBNP in the last 8760 hours. CBG: No results for input(s): GLUCAP in the last 168 hours. D-Dimer: No results for input(s): DDIMER in the last 72 hours. Hgb A1c: No results for input(s): HGBA1C in the last 72 hours. Lipid Profile: No results for input(s): CHOL, HDL, LDLCALC, TRIG, CHOLHDL, LDLDIRECT in the last 72 hours. Thyroid function studies: No results for input(s): TSH, T4TOTAL, T3FREE, THYROIDAB in the last 72 hours.  Invalid input(s): FREET3 Anemia work up: No results for input(s): VITAMINB12, FOLATE, FERRITIN, TIBC, IRON, RETICCTPCT in the last 72 hours. Sepsis Labs: Recent Labs  Lab 11/01/19 2340 11/01/19 2340  11/02/19 0530 11/03/19 0644 11/05/19 0529 11/06/19 0457 11/07/19 0328 11/08/19 0356  WBC 12.2*   < > 11.0*   < > 8.0 14.4* 19.8* 12.7*  LATICACIDVEN 1.1  --  1.2  --   --   --   --   --    < > = values in this interval not displayed.    Microbiology Recent Results (from the past 240 hour(s))  Blood Culture (routine x 2)     Status: None   Collection Time: 11/01/19 11:40 PM   Specimen: BLOOD  Result Value Ref Range Status   Specimen Description BLOOD SITE NOT SPECIFIED  Final   Special Requests   Final    BOTTLES DRAWN AEROBIC AND ANAEROBIC Blood Culture results may not be optimal due to an inadequate volume of blood received in culture bottles   Culture   Final    NO GROWTH 5 DAYS Performed at [facility], [place] [address] [place], [place] [address]    Report Status 11/07/2019 FINAL  Final  Blood Culture (routine x 2)  Status: None   Collection Time: 11/01/19 11:48 PM   Specimen: BLOOD  Result Value Ref Range Status   Specimen Description BLOOD SITE NOT SPECIFIED  Final   Special Requests   Final    BOTTLES DRAWN AEROBIC AND ANAEROBIC Blood Culture adequate volume   Culture   Final    NO GROWTH 5 DAYS Performed at [facility], [address] [place], [place] [address]    Report Status 11/07/2019 FINAL  Final  SARS CORONAVIRUS 2 (TAT 6-24 HRS) Nasopharyngeal Nasopharyngeal Swab     Status: None   Collection Time: 11/02/19 12:52 AM   Specimen: Nasopharyngeal Swab  Result Value Ref Range Status   SARS Coronavirus 2 NEGATIVE NEGATIVE Final    Comment: (NOTE) SARS-CoV-2 target nucleic acids are NOT DETECTED. The SARS-CoV-2 RNA is generally detectable in upper and lower respiratory specimens during the acute phase of infection. Negative results do not preclude SARS-CoV-2 infection, do not rule out co-infections with other pathogens, and should not be used as the sole basis for treatment or other patient management decisions. Negative results must be  combined with clinical observations, patient history, and epidemiological information. The expected result is Negative. Fact Sheet for Patients: SugarRoll.be Fact Sheet for Healthcare Providers: https://www.woods-mathews.com/ This test is not yet approved or cleared by the [place] FDA and  has been authorized for detection and/or diagnosis of SARS-CoV-2 by FDA under an Emergency Use Authorization (EUA). This EUA will remain  in effect (meaning this test can be used) for the duration of the COVID-19 declaration under Section 56 4(b)(1) of the Act, 21 U.S.C. section 360bbb-3(b)(1), unless the authorization is terminated or revoked sooner. Performed at [facility], [place] [address], [place], [place] [address]   Urine culture     Status: Abnormal   Collection Time: 11/02/19  2:00 AM   Specimen: In/Out Cath Urine  Result Value Ref Range Status   Specimen Description IN/OUT CATH URINE  Final   Special Requests   Final    NONE Performed at [facility], [place] [address] [place], [place] [address]    Culture MULTIPLE SPECIES PRESENT, SUGGEST RECOLLECTION (A)  Final   Report Status 11/02/2019 FINAL  Final    Procedures and diagnostic studies:  DG CHEST PORT 1 VIEW  Result Date: 11/07/2019 CLINICAL DATA:  Right upper lobe pneumonia EXAM: PORTABLE CHEST 1 VIEW COMPARISON:  10/20/2019 FINDINGS: There is a linear band of right perihilar of airspace disease paralleling the major fissure likely reflecting atelectasis versus scarring. The majority of the right upper lobe airspace disease has resolved. There is no pleural effusion or pneumothorax. The heart and mediastinal contours are unremarkable. There is no acute osseous abnormality. IMPRESSION: 1. Majority of the right upper lobe airspace disease has nearly completely resolved. Linear band of right perihilar airspace disease paralleling the major fissure likely reflecting atelectasis  versus scarring. Electronically Signed   By: Kathreen Devoid   On: 11/07/2019 12:23    Medications:   . amLODipine  10 mg Oral Daily  . atorvastatin  10 mg Oral Daily  . clopidogrel  75 mg Oral Q breakfast  . colchicine  0.3 mg Oral Daily  . docusate sodium  100 mg Oral BID  . enoxaparin (LOVENOX) injection  30 mg Subcutaneous Daily  . famotidine  20 mg Oral BID  . gabapentin  100 mg Oral QHS  . mirtazapine  30 mg Oral QHS   Continuous Infusions:   LOS: 6  days   [place] [facility]   How to contact the [facility] Attending or Consulting provider [place] or covering provider during after hours [place], for this patient?  1. Check the care team in [facility] and look for a) attending/consulting TR[facility] provider listed and b) the [facility] team listed 2. Log into [email or web address] and use [place]'s universal password to access. If you do not have the password, please contact the hospital operator. 3. Locate the [facility] provider you are looking for under [facility] and page to a number that you can be directly reached. 4. If you still have difficulty reaching the provider, please page the [facility] (Director on Call) for the Hospitalists listed on amion for assistance.  11/08/2019, 11:10 AM

## 2019-11-08 NOTE — Plan of Care (Signed)
  Problem: Activity: Goal: Risk for activity intolerance will decrease Outcome: Progressing   Problem: Pain Managment: Goal: General experience of comfort will improve Outcome: Progressing   

## 2019-11-09 MED ORDER — TRAMADOL HCL 50 MG PO TABS: 50.0000 mg | ORAL_TABLET | Freq: Four times a day (QID) | ORAL | 0 refills | Status: AC | PRN

## 2019-11-09 MED ORDER — MEGESTROL ACETATE 20 MG PO TABS: 20.0000 mg | ORAL_TABLET | Freq: Every day | ORAL | Status: AC

## 2019-11-09 MED ORDER — AMLODIPINE BESYLATE 10 MG PO TABS: 10.0000 mg | ORAL_TABLET | Freq: Every day | ORAL | Status: AC

## 2019-11-09 MED ORDER — COLCHICINE 0.6 MG PO TABS: 0.3000 mg | ORAL_TABLET | Freq: Every day | ORAL | Status: AC

## 2019-11-09 NOTE — Discharge Summary (Addendum)
Physician Discharge Summary  Kim Blevins HTX:[id number] DOB: [birth year] DOA: 11/01/2019  PCP: Cathlean Sauer, MD  Admit date: 11/01/2019 Discharge date: 11/09/2019  Admitted From: Home Discharge disposition: SNF   Recommendations for Outpatient Follow-Up:   1. Trial of Megace for appetite improvement 2. Aspiration precautions 3. CBC/BMP 1 week 4. Palliative care to follow at rehab facility 5. Follow imaging to ensure resolution of PNA   Discharge Diagnosis:   Principal Problem:   Pneumonia of right upper lobe due to Streptococcus pneumoniae [facility]) Active Problems:   Essential hypertension   Common bile duct dilatation   Bilateral lower extremity edema   Severe protein-calorie malnutrition (HCC)   GERD without esophagitis   Mixed hyperlipidemia   SIRS (systemic inflammatory response syndrome) (HCC)   Acute metabolic encephalopathy   AMS (altered mental status)   DNR (do not resuscitate)    Discharge Condition: Improved.  Diet recommendation: Low sodium, heart healthy.   Wound care: None.  Code status: DNR   History of Present Illness:   85 year old female with past medical history of hypertension, gastroesophageal reflux disease, severe protein calorie malnutrition and hyperlipidemia who presents to [facility] emergency department due to persisting pneumonia.  Patient is currently an extremely poor historian due to lethargy thought to be secondary to underlying pneumonia.  Patient was recently admitted to [facility] in early April for a right middle lobe pneumonia.  Patient was treated with 3 days of intravenous ceftriaxone and azithromycin and discharged home.  Since that hospitalization, patient has been experiencing progressively worsening lethargy, "always sleeping" according to the patient's family.  Patient is also been experiencing worsening bilateral lower extremity swelling, poor oral intake and episodes of  fever noted to be 101.8 F per PCP note.  With patient's worsening symptoms she presented to see her primary care provider on 4/14 where she was given a dose of intravenous ceftriaxone and prescribed a regimen of oral levofloxacin due to concerns for incompletely treated pneumonia.  However, the due to patient's progressive symptoms, patient was directed to go to [facility] emergency department for evaluation shortly thereafter.  Upon evaluation in the emergency department, chest x-ray reveals persisting pneumonia, now apparently in the right upper lobe with concurrent leukocytosis and tachycardia.  Patient was felt to be suffering from incompletely treated pneumonia with concurrent SIRS and therefore the hospitalist group was called to assess the patient for admission the hospital.    Hospital Course by Problem:   Sepsis due to lobar pneumonia/recurrent leukocytosis-present on admission -patient met criteria with tachycardia, tachypnea as well as elevated WBC, she was started on ceftriaxone and azithromycin and improved - finished 5 days of antibiotics on 4/18 -WBCs are trending down, so suspect reactive  Elevated LFTs with CBD dilation-patient does not have any GI symptoms however she has poor recollection whether she had GI symptoms at home. Her AST, ALT are elevated but stable, however total bilirubin increased from 0.8 on admission to 2.4 on 4/16. She underwent an MRI/MRCP which showed possible filling defect in the CBD.  -gastroenterology consult -ERCP was negative. Given positive MRCP and CBD dilation on ultrasound is possible that she may have passed a small stone.  -Resolved  Left ankle swelling- -Resolved, no swelling/erythema present  Essential hypertension -continue Norvasc, she is hypertensive laying down -If patient continues to have orthostatic issues may need to allow for permissive supine hypertension  Hyperlipidemia -continue  statin  Hypokalemia -Repleted  Irregular heart rate -sinus  arrhythmiawith presence of different morphology P waves, no evidence of A. fib currently  Severe protein calorie malnutrition -Encourage p.o. intake  History of TIA -Continue Plavix  Alzheimer's dementia- -Per family memory problems are mild.  Would recommend palliative care to follow patient at the rehab facility    Medical Consultants:   GI   Discharge Exam:   Vitals:   11/09/19 0440 11/09/19 0836  BP: (!) 165/60 (!) 147/62  Pulse: 70 76  Resp: 15 16  Temp: 98.3 F (36.8 C) 97.8 F (36.6 C)  SpO2: 95% 96%   Vitals:   11/08/19 1638 11/08/19 2056 11/09/19 0440 11/09/19 0836  BP: 139/72 135/68 (!) 165/60 (!) 147/62  Pulse: 90 90 70 76  Resp: '16 17 15 16  ' Temp: 98.4 F (36.9 C) 98.4 F (36.9 C) 98.3 F (36.8 C) 97.8 F (36.6 C)  TempSrc: Oral Oral  Axillary  SpO2: 98% 100% 95% 96%  Weight:  40.8 kg    Height:        General exam: Appears calm and comfortable.     The results of significant diagnostics from this hospitalization (including imaging, microbiology, ancillary and laboratory) are listed below for reference.     Procedures and Diagnostic Studies:   DG Ankle 2 Views Left  Result Date: 11/02/2019 CLINICAL DATA:  Swelling, left ankle swelling for 4 days. EXAM: LEFT ANKLE - 2 VIEW COMPARISON:  11/01/2019 FINDINGS: As compared to the study from the 14th of April there is persistent swelling about the ankle overlying the lateral malleolus. Osteopenia. No signs of fracture or dislocation. No bony erosion. Added density along the anterior joint may represent small effusion. IMPRESSION: Persistent soft tissue swelling about the ankle overlying the lateral malleolus. No underlying bony abnormality. Possible small effusion. Findings may be related to soft tissue injury, could also be associated with inflammatory arthropathy. Correlate with any clinical signs of infection. Electronically  Signed   By: Zetta Bills M.D.   On: 11/02/2019 10:56   DG Chest Port 1 View  Result Date: 11/01/2019 CLINICAL DATA:  Fever EXAM: PORTABLE CHEST 1 VIEW COMPARISON:  10/30/2019, CT chest 10/21/2019 FINDINGS: Partial consolidation in the right upper lobe remains, it now appears more dense and bandlike adjacent to the fissure. Left lung is clear. Normal heart size. No pneumothorax. Probable skin fold artifact over the left chest. IMPRESSION: Persistent partial consolidation in the right upper lobe which may be due to combination of atelectasis and pneumonia. Continued imaging follow-up to resolution is advised. Electronically Signed   By: Donavan Foil M.D.   On: 11/01/2019 23:39   ECHOCARDIOGRAM COMPLETE  Result Date: 11/02/2019    ECHOCARDIOGRAM REPORT   Patient Name:   ABIHA LUKEHART Date of Exam: 11/02/2019 Medical Rec #:  [id number]       Height:       63.0 in Accession #:    [id number]      Weight:       105.0 lb Date of Birth:  [birth year]        BSA:          1.470 m Patient Age:    9 years        BP:           150 /88 mmHg Patient Gender: F               HR:           90 bpm. Exam Location:  Inpatient Procedure: 2D Echo Indications:  CHF 428  History:        Patient has prior history of Echocardiogram examinations, most                 recent 11/15/2013. TIA and Stroke; Risk Factors:Hypertension.  Sonographer:    Jannett Celestine RDCS (AE) Referring Phys: [id number] [place]  1. Left ventricular ejection fraction, by estimation, is 60 to 65%. The left ventricle has normal function. The left ventricle has no regional wall motion abnormalities. Left ventricular diastolic function could not be evaluated.  2. Right ventricular systolic function is normal. The right ventricular size is normal.  3. The mitral valve is normal in structure. Trivial mitral valve regurgitation. No evidence of mitral stenosis.  4. The aortic valve is normal in structure. Aortic valve regurgitation is not  visualized. No aortic stenosis is present.  5. The inferior vena cava is normal in size with greater than 50% respiratory variability, suggesting right atrial pressure of 3 mmHg. Conclusion(s)/Recommendation(s): No intracardiac source of embolism detected on this transthoracic study. A transesophageal echocardiogram is recommended to exclude cardiac source of embolism if clinically indicated. FINDINGS  Left Ventricle: Left ventricular ejection fraction, by estimation, is 60 to 65%. The left ventricle has normal function. The left ventricle has no regional wall motion abnormalities. The left ventricular internal cavity size was normal in size. There is  no left ventricular hypertrophy. Left ventricular diastolic function could not be evaluated. Right Ventricle: The right ventricular size is normal. No increase in right ventricular wall thickness. Right ventricular systolic function is normal. Left Atrium: Left atrial size was normal in size. Right Atrium: Right atrial size was normal in size. Pericardium: There is no evidence of pericardial effusion. Mitral Valve: The mitral valve is normal in structure. Normal mobility of the mitral valve leaflets. Moderate mitral annular calcification. Trivial mitral valve regurgitation. No evidence of mitral valve stenosis. Tricuspid Valve: The tricuspid valve is normal in structure. Tricuspid valve regurgitation is mild . No evidence of tricuspid stenosis. Aortic Valve: The aortic valve is normal in structure. Aortic valve regurgitation is not visualized. No aortic stenosis is present. Pulmonic Valve: The pulmonic valve was normal in structure. Pulmonic valve regurgitation is not visualized. No evidence of pulmonic stenosis. Aorta: The aortic root is normal in size and structure. Venous: The inferior vena cava is normal in size with greater than 50% respiratory variability, suggesting right atrial pressure of 3 mmHg. IAS/Shunts: No atrial level shunt detected by color flow  Doppler.  LEFT VENTRICLE PLAX 2D LVIDd:         4.70 cm LVIDs:         3.50 cm LV PW:         1.10 cm LV IVS:        0.80 cm LVOT diam:     1.90 cm LV SV:         44 LV SV Index:   30 LVOT Area:     2.84 cm  LEFT ATRIUM             Index LA diam:        3.50 cm 2.38 cm/m LA Vol (A2C):   35.1 ml 23.87 ml/m LA Vol (A4C):   36.5 ml 24.82 ml/m LA Biplane Vol: 39.9 ml 27.13 ml/m  AORTIC VALVE LVOT Vmax:   82.90 cm/s LVOT Vmean:  55.300 cm/s LVOT VTI:    0.155 m  AORTA Ao Root diam: 2.90 cm MITRAL VALVE MV Area (PHT): 2.60 cm  SHUNTS MV Decel Time: 292 msec    Systemic VTI:  0.16 m MV E velocity: 44.60 cm/s  Systemic Diam: 1.90 cm Ena Dawley MD Electronically signed by Ena Dawley MD Signature Date/Time: 11/02/2019/10:43:25 PM    Final      Labs:   Basic Metabolic Panel: Recent Labs  Lab 11/03/19 4268 11/03/19 3419 11/04/19 6222 11/04/19 9798 11/05/19 0529 11/05/19 0529 11/06/19 0457 11/06/19 0457 11/07/19 0328 11/08/19 0356  NA 138   < > 139  --  141  --  140  --  138 139  K 5.3*   < > 3.0*   < > 3.4*   < > 3.3*   < > 3.3* 3.2*  CL 96*   < > 97*  --  101  --  99  --  100 106  CO2 24   < > 27  --  29  --  28  --  25 26  GLUCOSE 96   < > 111*  --  106*  --  134*  --  122* 105*  BUN 11   < > 12  --  13  --  24*  --  30* 18  CREATININE 0.61   < > 0.60  --  0.62  --  0.73  --  0.63 0.61  CALCIUM 8.8*   < > 8.9  --  9.2  --  9.4  --  8.8* 8.8*  MG 1.8  --   --   --   --   --   --   --   --  1.7  PHOS  --   --   --   --   --   --   --   --   --  2.6   < > = values in this interval not displayed.   GFR Estimated Creatinine Clearance: 34.9 mL/min (by C-G formula based on SCr of 0.61 mg/dL). Liver Function Tests: Recent Labs  Lab 11/03/19 0644 11/04/19 0716 11/05/19 0529 11/07/19 0328 11/08/19 0356  AST 160* 98* 56* 31 31  ALT 88* 79* 62* 37 33  ALKPHOS 122 111 107 81 79  BILITOT 2.4* 0.5 0.4 0.3 0.4  PROT 6.0* 6.0* 6.1* 5.6* 5.4*  ALBUMIN 2.3* 2.4* 2.3* 2.5* 2.5*    No results for input(s): LIPASE, AMYLASE in the last 168 hours. No results for input(s): AMMONIA in the last 168 hours. Coagulation profile No results for input(s): INR, PROTIME in the last 168 hours.  CBC: Recent Labs  Lab 11/03/19 0644 11/03/19 0644 11/04/19 0716 11/05/19 0529 11/06/19 0457 11/07/19 0328 11/08/19 0356  WBC 9.8   < > 8.3 8.0 14.4* 19.8* 12.7*  NEUTROABS 6.1  --   --   --   --   --   --   HGB 11.8*   < > 11.3* 11.4* 10.7* 10.8* 9.6*  HCT 38.1   < > 35.8* 36.7 33.4* 33.1* 30.8*  MCV 93.6   < > 92.7 93.1 92.0 92.2 94.5  PLT 442*   < > 484* 442* 542* 443* 374   < > = values in this interval not displayed.   Cardiac Enzymes: No results for input(s): CKTOTAL, CKMB, CKMBINDEX, TROPONINI in the last 168 hours. BNP: Invalid input(s): POCBNP CBG: No results for input(s): GLUCAP in the last 168 hours. D-Dimer No results for input(s): DDIMER in the last 72 hours. Hgb A1c No results for input(s): HGBA1C in the last 72 hours. Lipid Profile No results  for input(s): CHOL, HDL, LDLCALC, TRIG, CHOLHDL, LDLDIRECT in the last 72 hours. Thyroid function studies No results for input(s): TSH, T4TOTAL, T3FREE, THYROIDAB in the last 72 hours.  Invalid input(s): FREET3 Anemia work up No results for input(s): VITAMINB12, FOLATE, FERRITIN, TIBC, IRON, RETICCTPCT in the last 72 hours. Microbiology Recent Results (from the past 240 hour(s))  Blood Culture (routine x 2)     Status: None   Collection Time: 11/01/19 11:40 PM   Specimen: BLOOD  Result Value Ref Range Status   Specimen Description BLOOD SITE NOT SPECIFIED  Final   Special Requests   Final    BOTTLES DRAWN AEROBIC AND ANAEROBIC Blood Culture results may not be optimal due to an inadequate volume of blood received in culture bottles   Culture   Final    NO GROWTH 5 DAYS Performed at [facility], [place] [address], [place], [place] [address]    Report Status 11/07/2019 FINAL  Final  Blood Culture (routine x  2)     Status: None   Collection Time: 11/01/19 11:48 PM   Specimen: BLOOD  Result Value Ref Range Status   Specimen Description BLOOD SITE NOT SPECIFIED  Final   Special Requests   Final    BOTTLES DRAWN AEROBIC AND ANAEROBIC Blood Culture adequate volume   Culture   Final    NO GROWTH 5 DAYS Performed at [facility], [place] [address] [place], [place] [address]    Report Status 11/07/2019 FINAL  Final  SARS CORONAVIRUS 2 (TAT 6-24 HRS) Nasopharyngeal Nasopharyngeal Swab     Status: None   Collection Time: 11/02/19 12:52 AM   Specimen: Nasopharyngeal Swab  Result Value Ref Range Status   SARS Coronavirus 2 NEGATIVE NEGATIVE Final    Comment: (NOTE) SARS-CoV-2 target nucleic acids are NOT DETECTED. The SARS-CoV-2 RNA is generally detectable in upper and lower respiratory specimens during the acute phase of infection. Negative results do not preclude SARS-CoV-2 infection, do not rule out co-infections with other pathogens, and should not be used as the sole basis for treatment or other patient management decisions. Negative results must be combined with clinical observations, patient history, and epidemiological information. The expected result is Negative. Fact Sheet for Patients: SugarRoll.be Fact Sheet for Healthcare Providers: https://www.woods-mathews.com/ This test is not yet approved or cleared by the [place] FDA and  has been authorized for detection and/or diagnosis of SARS-CoV-2 by FDA under an Emergency Use Authorization (EUA). This EUA will remain  in effect (meaning this test can be used) for the duration of the COVID-19 declaration under Section 56 4(b)(1) of the Act, 21 U.S.C. section 360bbb-3(b)(1), unless the authorization is terminated or revoked sooner. Performed at [facility], [place] [address] [place], [place] [address]   Urine culture     Status: Abnormal   Collection Time: 11/02/19  2:00 AM    Specimen: In/Out Cath Urine  Result Value Ref Range Status   Specimen Description IN/OUT CATH URINE  Final   Special Requests   Final    NONE Performed at [facility], [place] [address], [place], [place] [address]    Culture MULTIPLE SPECIES PRESENT, SUGGEST RECOLLECTION (A)  Final   Report Status 11/02/2019 FINAL  Final  Culture, Urine     Status: None   Collection Time: 11/07/19  2:55 PM   Specimen: Urine, Random  Result Value Ref Range Status   Specimen Description URINE, RANDOM  Final   Special Requests  NONE  Final   Culture   Final    NO GROWTH Performed at [facility], [place] [address], [place], [place] [address]    Report Status 11/08/2019 FINAL  Final  SARS CORONAVIRUS 2 (TAT 6-24 HRS) Nasopharyngeal Nasopharyngeal Swab     Status: None   Collection Time: 11/08/19 10:40 AM   Specimen: Nasopharyngeal Swab  Result Value Ref Range Status   SARS Coronavirus 2 NEGATIVE NEGATIVE Final    Comment: (NOTE) SARS-CoV-2 target nucleic acids are NOT DETECTED. The SARS-CoV-2 RNA is generally detectable in upper and lower respiratory specimens during the acute phase of infection. Negative results do not preclude SARS-CoV-2 infection, do not rule out co-infections with other pathogens, and should not be used as the sole basis for treatment or other patient management decisions. Negative results must be combined with clinical observations, patient history, and epidemiological information. The expected result is Negative. Fact Sheet for Patients: SugarRoll.be Fact Sheet for Healthcare Providers: https://www.woods-mathews.com/ This test is not yet approved or cleared by the [place] FDA and  has been authorized for detection and/or diagnosis of SARS-CoV-2 by FDA under an Emergency Use Authorization (EUA). This EUA will remain  in effect (meaning this test can be used) for the duration of the COVID-19 declaration under Section 56  4(b)(1) of the Act, 21 U.S.C. section 360bbb-3(b)(1), unless the authorization is terminated or revoked sooner. Performed at [facility], [place] [address], [place], [place] [address]      Discharge Instructions:   Discharge Instructions    Diet - low sodium heart healthy   Complete by: As directed    Increase activity slowly   Complete by: As directed      Allergies as of 11/09/2019      Reactions   Plaquenil [hydroxychloroquine Sulfate] Rash      Medication List    STOP taking these medications   aspirin-acetaminophen-caffeine 250-250-65 MG tablet Commonly known as: EXCEDRIN MIGRAINE   levETIRAcetam 500 MG tablet Commonly known as: KEPPRA   lisinopril 40 MG tablet Commonly known as: ZESTRIL   predniSONE 20 MG tablet Commonly known as: DELTASONE   zolpidem 10 MG tablet Commonly known as: AMBIEN     TAKE these medications   alendronate 70 MG tablet Commonly known as: FOSAMAX Take 70 mg by mouth every Sunday. Take with a full glass of water on an empty stomach.   amLODipine 10 MG tablet Commonly known as: NORVASC Take 1 tablet (10 mg total) by mouth daily. Start taking on: November 10, 2019 What changed:   medication strength  how much to take   ascorbic acid 500 MG tablet Commonly known as: VITAMIN C Take 500 mg by mouth 2 (two) times daily. For 30 days   atorvastatin 10 MG tablet Commonly known as: LIPITOR Take 10 mg by mouth daily.   clopidogrel 75 MG tablet Commonly known as: PLAVIX Take 75 mg by mouth daily with breakfast.   colchicine 0.6 MG tablet Take 0.5 tablets (0.3 mg total) by mouth daily. Start taking on: November 10, 2019   docusate sodium 100 MG capsule Commonly known as: COLACE Take 100 mg by mouth 2 (two) times daily.   famotidine 20 MG tablet Commonly known as: PEPCID Take 20 mg by mouth 2 (two) times daily.   FISH OIL PO Take 1 capsule by mouth daily.   gabapentin 100 MG capsule Commonly known as: NEURONTIN Take 100  mg by mouth at bedtime.   megestrol 20 MG  tablet Commonly known as: MEGACE Take 1 tablet (20 mg total) by mouth daily. Start taking on: November 10, 2019   mirtazapine 30 MG tablet Commonly known as: REMERON Take 30 mg by mouth at bedtime.   MULTIVITAMIN PO Take 1 tablet by mouth daily.   traMADol 50 MG tablet Commonly known as: ULTRAM Take 1 tablet (50 mg total) by mouth every 6 (six) hours as needed for moderate pain.   zinc sulfate 220 (50 Zn) MG capsule Take 220 mg by mouth daily.      Contact information for after-discharge care    Destination    [facility] .   Service: Skilled Nursing Contact information: [address] [place] [phone]               Time coordinating discharge: 35 minutes  Signed:  Geradine Girt DO  [facility] 11/09/2019, 10:50 AM

## 2019-11-09 NOTE — Progress Notes (Signed)
Physical Therapy Treatment Patient Details Name: Kim Blevins MRN: [id number] DOB: Apr 16, 1935 Today's Date: 11/09/2019    History of Present Illness 85 year old female presenting to ED by PCP due to lethargy, confusion, and persistent pneumonia. PMH of HTN, GERD, Lupus, stroke, probable Alzheimer's dementia severe protein calorie malnutrition and hyperlipidemia.    PT Comments    Pt seen with cotx with OT due for safety with mobility due to hx orthostatic hypotension and easily fatigued.  Pt was min A for bed mobility and transfer to chair; however, fatigues very easily and requesting to sit down. Orthostatic BP was stable today; however, HR up to 129 bpm with activity and quickly recovers with rest.  Pt with decreased insight into deficits - stating she doesn't understand why she is unable to return home.  Pt's spouse was present.    Follow Up Recommendations  SNF     Equipment Recommendations  Other (comment)(defer to next venue)    Recommendations for Other Services       Precautions / Restrictions Precautions Precautions: Fall Precaution Comments: orthostatic; monitor HR Restrictions Weight Bearing Restrictions: No    Mobility  Bed Mobility Overal bed mobility: Needs Assistance Bed Mobility: Supine to Sit     Supine to sit: HOB elevated;Min assist     General bed mobility comments: Pt requires assist to get into full sitting but did move to EOB with min assist.  Increased time and cues for sequence  Transfers Overall transfer level: Needs assistance Equipment used: Rolling walker (2 wheeled) Transfers: Sit to/from [facility] Sit to Stand: Min assist Stand pivot transfers: Min assist       General transfer comment: Sit to stand x 2 with cues for safe hand placement; fatigues easily  Ambulation/Gait Ambulation/Gait assistance: Min assist;+2 safety/equipment Gait Distance (Feet): 3 Feet Assistive device: Rolling walker (2 wheeled) Gait  Pattern/deviations: Step-to pattern;Decreased stride length;Shuffle Gait velocity: markedly dec   General Gait Details: small steps to chair only; required support for balance and RW   Stairs             Wheelchair Mobility    Modified Rankin (Stroke Patients Only)       Balance Overall balance assessment: Needs assistance Sitting-balance support: Feet supported;Single extremity supported Sitting balance-Leahy Scale: Fair     Standing balance support: During functional activity;Bilateral upper extremity supported Standing balance-Leahy Scale: Poor Standing balance comment: Pt had 2 LOB posteriorly requiring min A for steadying; low tolerance for standing -fatigues after ~30 sec                            Cognition Arousal/Alertness: Awake/alert Behavior During Therapy: WFL for tasks assessed/performed;Restless Overall Cognitive Status: History of cognitive impairments - at baseline Area of Impairment: Safety/judgement;Memory;Awareness                     Memory: Decreased short-term memory   Safety/Judgement: Decreased awareness of deficits Awareness: Emergent   General Comments: Pt with decreased insight into deficits - stating that she doesn't understand why she cannot go home, but was unable to stand or walk without assist.      Exercises      General Comments General comments (skin integrity, edema, etc.): Pt very limited with activity tolerance and safety awareness.  Orthostatic BP Supine 155/88 and HR 75 Sit 123/71 and HR 103 Unable to stand entire time for BP, BP as soon as pt sat 141/89 and  HR 64  Monitored HR on monitor and increased as high as 129 bpm with transfer to chair     Pertinent Vitals/Pain Pain Assessment: Faces Faces Pain Scale: No hurt    Home Living                      Prior Function            PT Goals (current goals can now be found in the care plan section) Acute Rehab PT Goals Patient  Stated Goal: to go home Progress towards PT goals: Progressing toward goals    Frequency    Min 3X/week      PT Plan Current plan remains appropriate    Co-evaluation PT/OT/SLP Co-Evaluation/Treatment: Yes Reason for Co-Treatment: Complexity of the patient's impairments (multi-system involvement) PT goals addressed during session: Mobility/safety with mobility OT goals addressed during session: ADL's and self-care      AM-PAC PT "6 Clicks" Mobility   Outcome Measure  Help needed turning from your back to your side while in a flat bed without using bedrails?: A Little Help needed moving from lying on your back to sitting on the side of a flat bed without using bedrails?: A Little Help needed moving to and from a bed to a chair (including a wheelchair)?: A Little Help needed standing up from a chair using your arms (e.g., wheelchair or bedside chair)?: A Little Help needed to walk in hospital room?: A Lot Help needed climbing 3-5 steps with a railing? : A Lot 6 Click Score: 16    End of Session Equipment Utilized During Treatment: Gait belt Activity Tolerance: Patient limited by fatigue;Other (comment)(limited by elevated HR) Patient left: with call bell/phone within reach;in chair;with chair alarm set Nurse Communication: Mobility status PT Visit Diagnosis: Unsteadiness on feet (R26.81);Other abnormalities of gait and mobility (R26.89)     Time: [id number] PT Time Calculation (min) (ACUTE ONLY): 23 min  Charges:  $Therapeutic Activity: 8-22 mins                     Maggie Font, PT Acute Rehab Services Pager [phone] [facility] [phone] [facility] [phone]    Karlton Lemon 11/09/2019, 12:54 PM

## 2019-11-09 NOTE — Plan of Care (Signed)
  Problem: Activity: Goal: Risk for activity intolerance will decrease Outcome: Progressing   

## 2019-11-09 NOTE — Progress Notes (Signed)
Occupational Therapy Treatment Patient Details Name: Kim Blevins MRN: [id number] DOB: 1934/11/01 Today's Date: 11/09/2019    History of present illness 85 year old female presenting to ED by PCP due to lethargy, confusion, and persistent pneumonia. PMH of HTN, GERD, Lupus, stroke, probable Alzheimer's dementia severe protein calorie malnutrition and hyperlipidemia.   OT comments  Pt making very slow progress.  Remains very weak and fatigued with very little activity.  Pt orthostatics all normal in supine, sitting and standing. HR was up to 138 with activity.  Pt to go to SNF rehab today to increase independence with adls so she can return home with her husband.  Feel this is an appropriate d/c.   Follow Up Recommendations  SNF;Supervision/Assistance - 24 hour    Equipment Recommendations       Recommendations for Other Services      Precautions / Restrictions Precautions Precautions: Fall Precaution Comments: orthostatic; monitor HR Restrictions Weight Bearing Restrictions: No       Mobility Bed Mobility Overal bed mobility: Needs Assistance Bed Mobility: Supine to Sit     Supine to sit: HOB elevated;Mod assist     General bed mobility comments: Pt requires assist to get into full sitting but did move to EOB with min assist.  Transfers Overall transfer level: Needs assistance Equipment used: 1 person hand held assist;2 person hand held assist Transfers: Sit to/from [facility] Sit to Stand: Min assist Stand pivot transfers: Min assist       General transfer comment: Pt transfers with min assist but cannot maintain standing for much longer than 30 seconds to a minute without fatiguing.     Balance Overall balance assessment: Needs assistance Sitting-balance support: Feet supported;Single extremity supported Sitting balance-Leahy Scale: Fair     Standing balance support: Bilateral upper extremity supported;During functional activity;Single  extremity supported Standing balance-Leahy Scale: Poor Standing balance comment: Pt requires UE support to maintain balance. 2 instances (both backwards) of LOB requiring minA to steady                           ADL either performed or assessed with clinical judgement   ADL Overall ADL's : Needs assistance/impaired Eating/Feeding: Set up;Sitting;Supervision/ safety                       Toilet Transfer: Minimal assistance;Min guard;Ambulation;RW Toilet Transfer Details (indicate cue type and reason): Pt transferred to [facility] with min assist. Pt kyphotic and HR up to 128 with activity. Toileting- Clothing Manipulation and Hygiene: Moderate assistance;Sit to/from stand       Functional mobility during ADLs: Min guard;Rolling walker;Minimal assistance General ADL Comments: Pt very limited right now with all adls secondary to fatigue.  Pt stood for appx 2 minutes and just could not tolerate standing any longer.  Pt very unaware of limitations as pt just wants to go home with her husband.  She realizes she is weak but does not understand why she cannot go hom.     Vision   Vision Assessment?: No apparent visual deficits   Perception     Praxis      Cognition Arousal/Alertness: Awake/alert Behavior During Therapy: WFL for tasks assessed/performed;Restless Overall Cognitive Status: History of cognitive impairments - at baseline Area of Impairment: Safety/judgement;Memory;Awareness                     Memory: Decreased short-term memory   Safety/Judgement: Decreased awareness of deficits  Awareness: Emergent   General Comments: Pt with decreased insight into deficits - stating that she doesn't understand why she cannot go home, but was unable to stand or walk without assist.        Exercises     Shoulder Instructions       General Comments Pt very limited with activity tolerance and safety awareness.    Pertinent Vitals/ Pain       Pain  Assessment: Faces Faces Pain Scale: No hurt  Home Living                                          Prior Functioning/Environment              Frequency  Min 2X/week        Progress Toward Goals  OT Goals(current goals can now be found in the care plan section)  Progress towards OT goals: Progressing toward goals  Acute Rehab OT Goals Patient Stated Goal: to go home OT Goal Formulation: With patient/family Time For Goal Achievement: 11/17/19 Potential to Achieve Goals: Fair ADL Goals Pt Will Perform Grooming: with modified independence;standing Pt Will Perform Lower Body Dressing: with supervision;sit to/from stand Pt Will Transfer to Toilet: with supervision;ambulating;bedside commode Pt Will Perform Toileting - Clothing Manipulation and hygiene: with supervision;sit to/from stand;sitting/lateral leans Additional ADL Goal #1: Pt will be aware of energy conservation strategies from handout that will be of benefit to her.  Plan Discharge plan remains appropriate    Co-evaluation    PT/OT/SLP Co-Evaluation/Treatment: Yes Reason for Co-Treatment: Complexity of the patient's impairments (multi-system involvement) PT goals addressed during session: Mobility/safety with mobility OT goals addressed during session: ADL's and self-care      AM-PAC OT "6 Clicks" Daily Activity     Outcome Measure   Help from another person eating meals?: A Little Help from another person taking care of personal grooming?: A Little Help from another person toileting, which includes using toliet, bedpan, or urinal?: A Lot Help from another person bathing (including washing, rinsing, drying)?: A Lot Help from another person to put on and taking off regular upper body clothing?: A Lot Help from another person to put on and taking off regular lower body clothing?: A Lot 6 Click Score: 14    End of Session Equipment Utilized During Treatment: Rolling walker  OT Visit  Diagnosis: Other abnormalities of gait and mobility (R26.89);Muscle weakness (generalized) (M62.81)   Activity Tolerance Patient limited by fatigue   Patient Left in chair;with call bell/phone within reach;with chair alarm set;with family/visitor present   Nurse Communication Mobility status        Time: [id number] OT Time Calculation (min): 20 min  Charges: OT General Charges $OT Visit: 1 Visit OT Treatments $Self Care/Home Management : 8-22 mins   Glenford Peers 11/09/2019, 12:19 PM

## 2019-11-09 NOTE — Progress Notes (Signed)
Kim Blevins to be discharged [facility] per MD order. Patient verbalized understanding.  Skin clean, dry and intact without evidence of skin break down, no evidence of skin tears noted. IV catheter discontinued intact. Site without signs and symptoms of complications. Dressing and pressure applied. Pt denies pain at the site currently. No complaints noted.  Patient free of lines, drains, and wounds.   Discharge packet assembled. An After Visit Summary (AVS) was printed and given to the EMS personnel. Patient escorted via stretcher and discharged to [facility] via ambulance. Report called to accepting facility; all questions and concerns addressed.   Baldo Ash, RN

## 2019-11-09 NOTE — TOC Transition Note (Signed)
Transition of Care [facility]) - CM/SW Discharge Note   Patient Details  Name: MANIYAH GUYMON MRN: [id number] Date of Birth: 10/04/1934  [facility] CM/SW Contact:  Bartholomew Crews, RN Phone Number: [phone] 11/09/2019, 11:25 AM   Clinical Narrative:     Patient to transition to [facility] for skilled rehab today. Verified bed availability. Patient will go to room 109. Nurse to call report to [phone]. DC order and summary completed. PTAR arranged. Medical transport paperwork on the chart. No further TOC needs identified.    Final next level of care: Skilled Nursing Facility Barriers to Discharge: No Barriers Identified   Patient Goals and CMS Choice Patient states their goals for this hospitalization and ongoing recovery are:: return home with her husband CMS Medicare.gov Compare Post Acute Care list provided to:: Patient Choice offered to / list presented to : Adult Children, Patient  Discharge Placement                Patient to be transferred to facility by: [place] Name of family member notified: Jonah Blue, son Patient and family notified of of transfer: 11/09/19  Discharge Plan and Services In-house Referral: Clinical Social Work Discharge Planning Services: CM Consult Post Acute Care Choice: [place]          DME Arranged: N/A DME Agency: NA       HH Arranged: NA HH Agency: NA        Social Determinants of Health (SDOH) Interventions     Readmission Risk Interventions No flowsheet data found.

## 2020-10-19 IMAGING — DX DG ANKLE 2V *L*
2 series · 2 of 2 positions shown · non-contrast
Comparison: 11/01/2019

CLINICAL DATA: Swelling, left ankle swelling for 4 days.

EXAM:
LEFT ANKLE - 2 VIEW

[ankle ap]
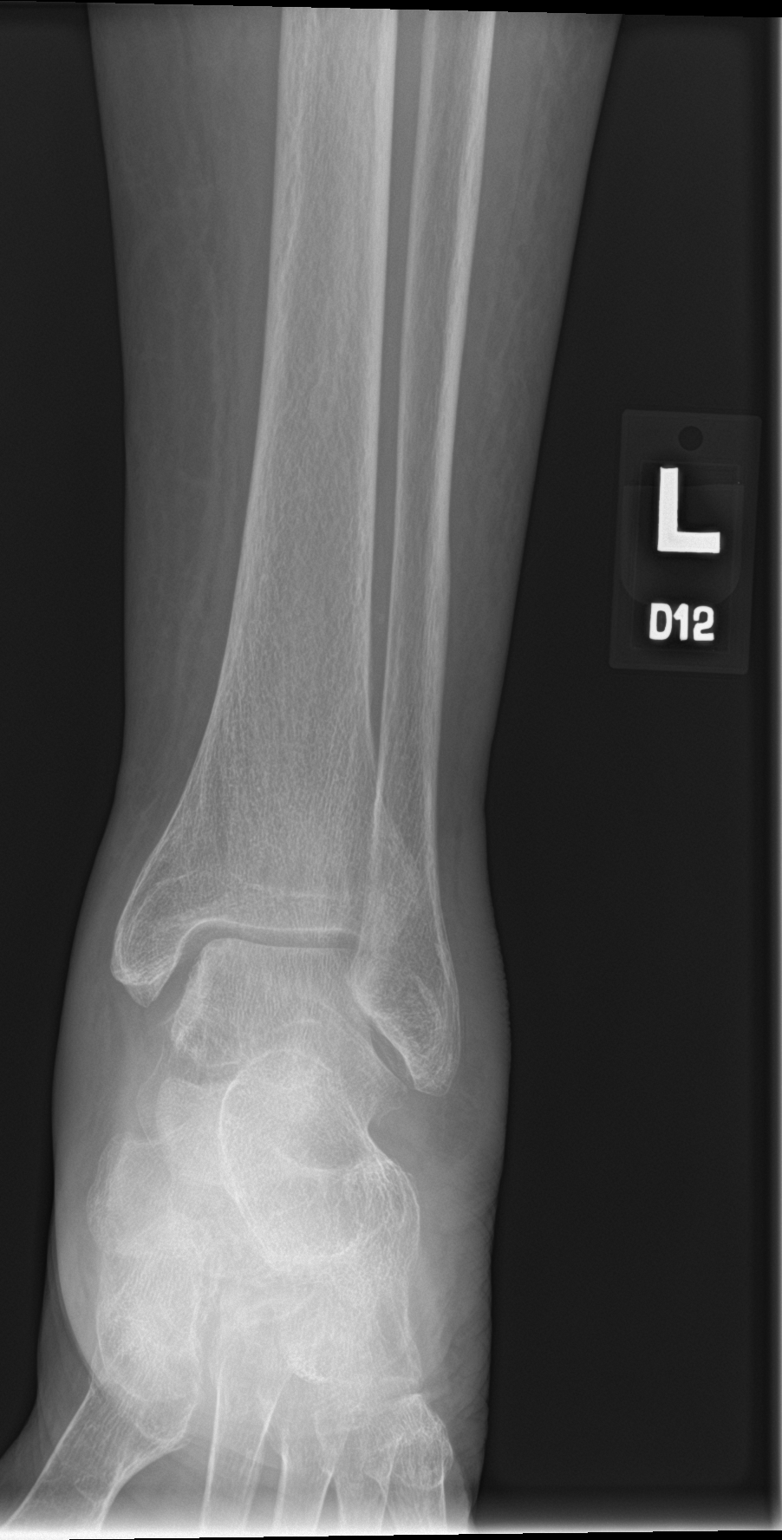

[ankle lat]
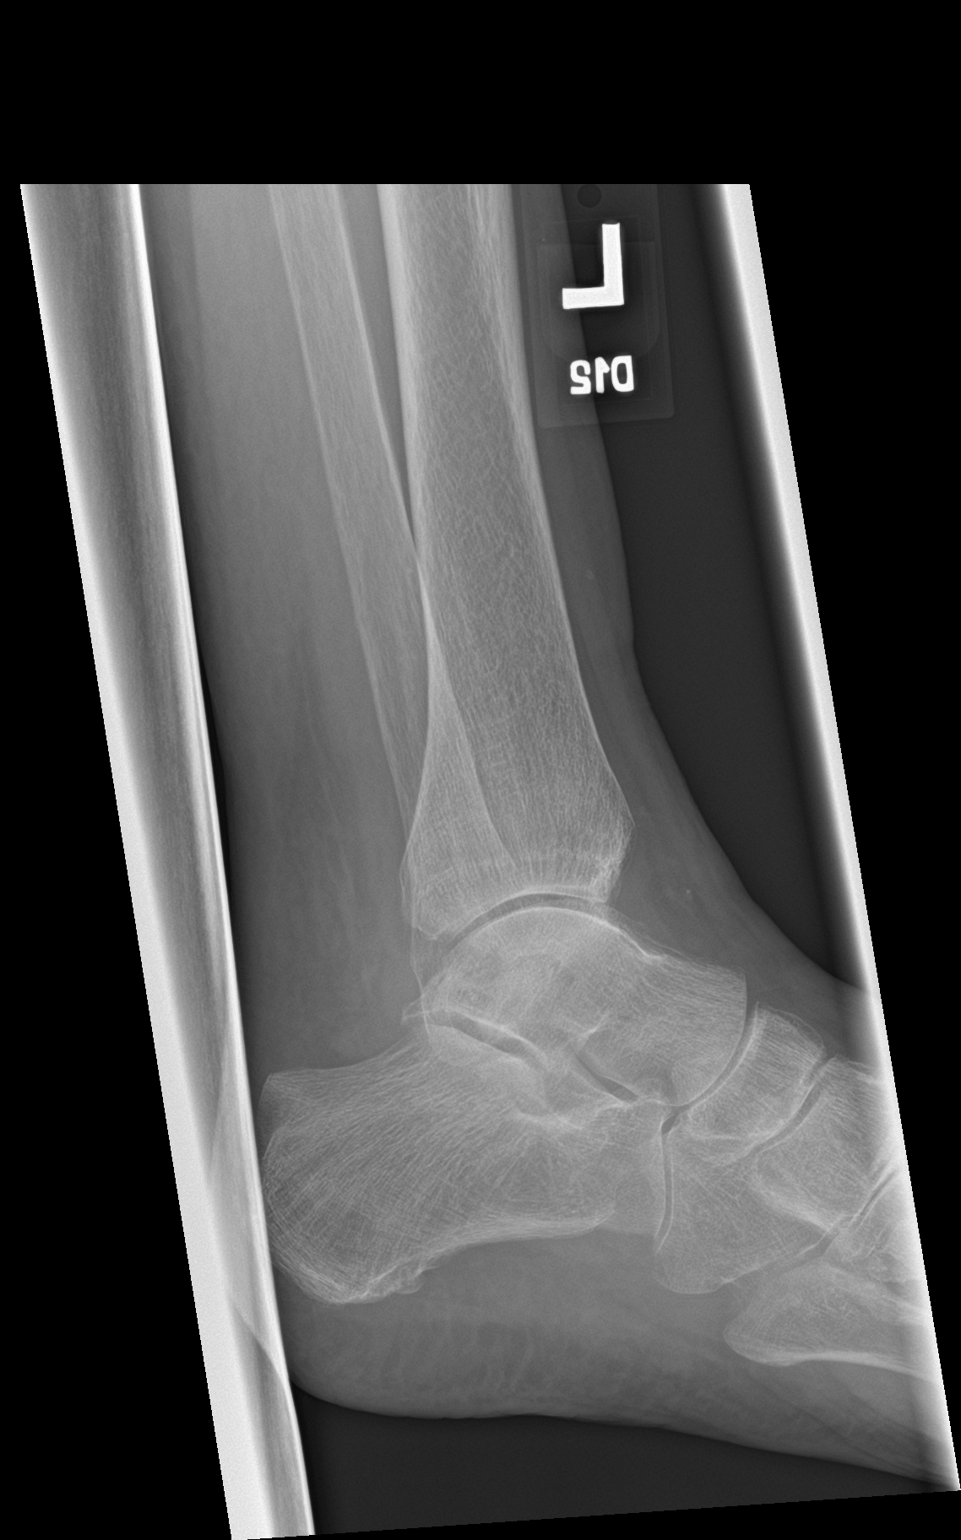

[2 of 2 positions shown; findings below may reference images not displayed]

FINDINGS: As compared to the study from the [REDACTED] there is persistent
swelling about the ankle overlying the lateral malleolus.

Osteopenia.

No signs of fracture or dislocation.

No bony erosion.

Added density along the anterior joint may represent small effusion.
IMPRESSION: Persistent soft tissue swelling about the ankle overlying the
lateral malleolus. No underlying bony abnormality. Possible small
effusion. Findings may be related to soft tissue injury, could also
be associated with inflammatory arthropathy. Correlate with any
clinical signs of infection.

## 2020-10-22 IMAGING — RF DG C-ARM 1-60 MIN
1 series · 4 of 4 positions shown · non-contrast
Comparison: None.

CLINICAL DATA: 84-year-old female with choledocholithiasis

EXAM:
ERCP
TECHNIQUE: Multiple spot images obtained with the fluoroscopic device and
submitted for interpretation post-procedure.
FLUOROSCOPY TIME:  Fluoroscopy Time:  1 minutes 42 seconds
[place]

[Series 1: unknown protocol · 0.20mm/px · 4 of 4 slices shown]
[im 1/4]
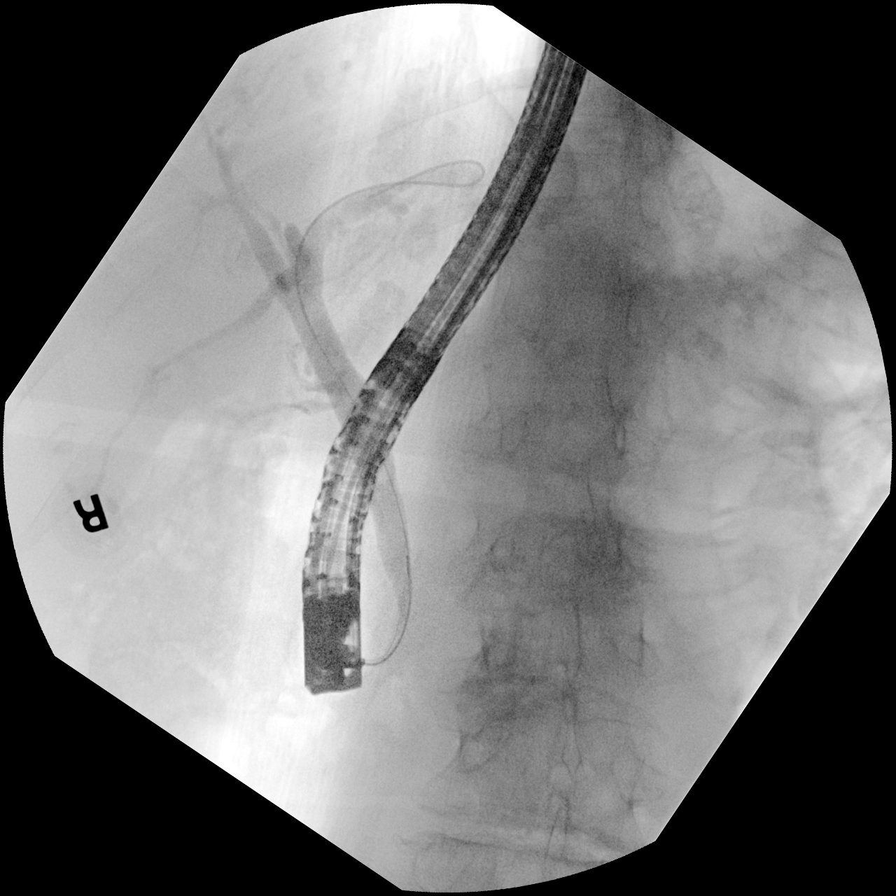
[im 2/4]
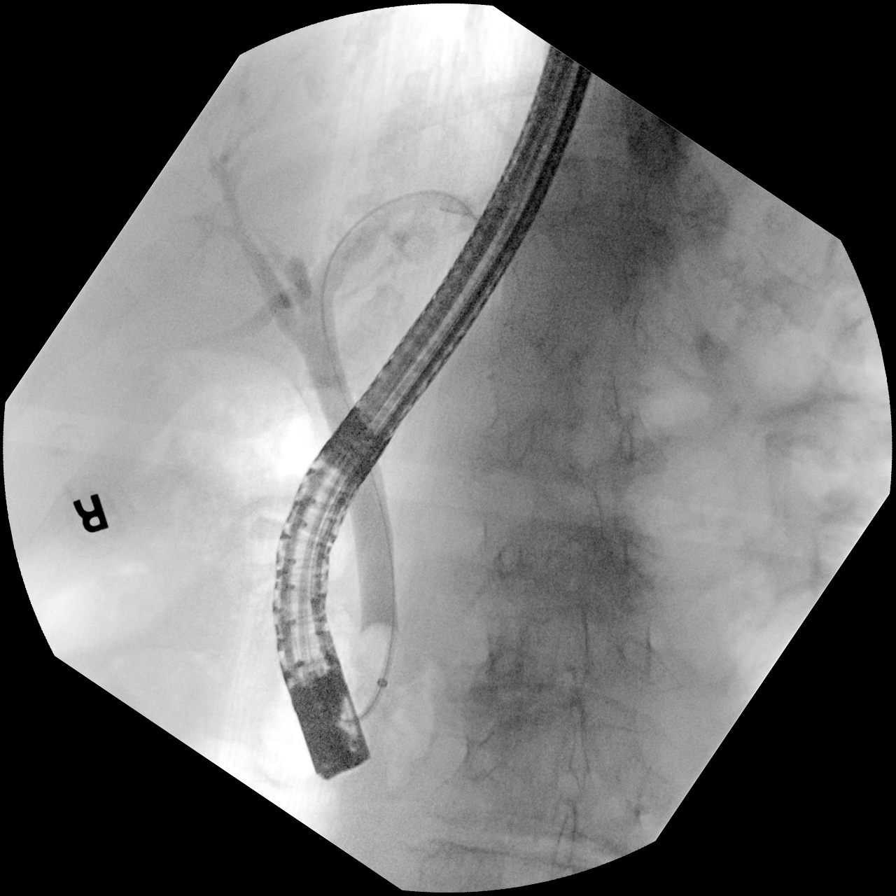
[im 3/4]
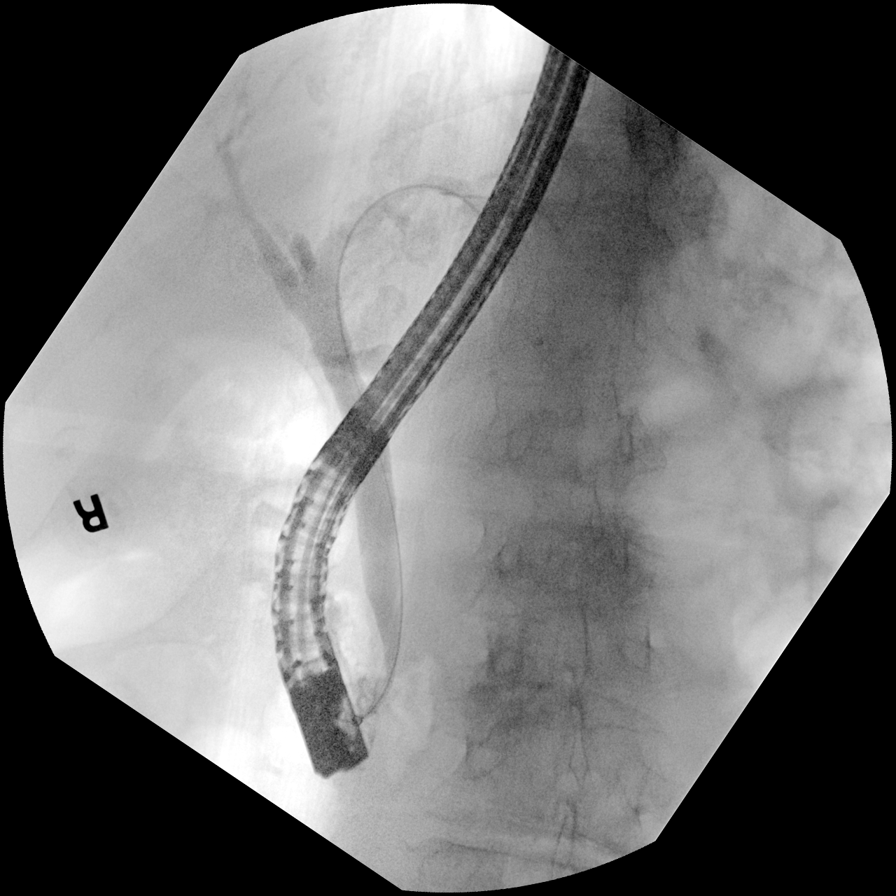
[im 4/4]
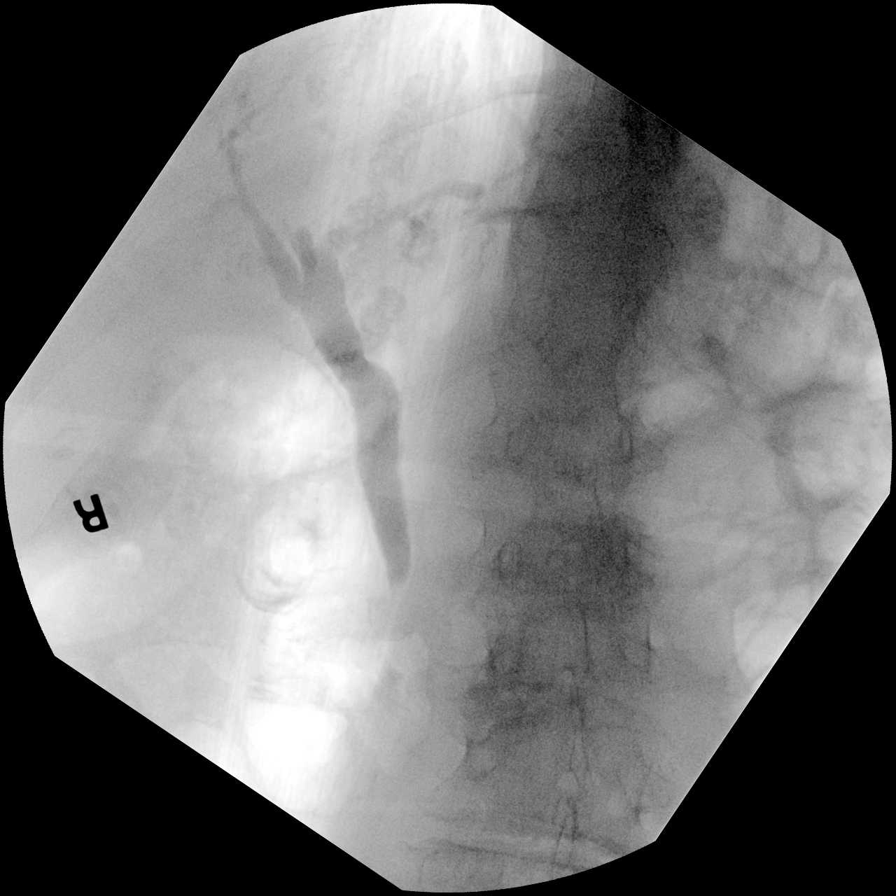

[4 of 4 positions shown; findings below may reference images not displayed]

FINDINGS: Limited intraoperative fluoroscopic images during ERCP.

Initial image demonstrates the endoscope projecting over the upper
abdomen with a safety wire in position in the extrahepatic biliary
ducts and partial opacification with contrast.

Subsequently there is deployment of a retrieval balloon.

Final image demonstrates no definite filling defects.
IMPRESSION: Limited images during ERCP demonstrates treatment of the
extrahepatic biliary ducts with deployment of balloon retrieval
catheter. Please refer to the dictated operative report for full
details of intraoperative findings and procedure.

## 2020-10-22 IMAGING — RF DG ERCP WO/W SPHINCTEROTOMY
1 series · 4 of 4 positions shown · non-contrast
Comparison: None.

CLINICAL DATA: 84-year-old female with choledocholithiasis

EXAM:
ERCP
TECHNIQUE: Multiple spot images obtained with the fluoroscopic device and
submitted for interpretation post-procedure.
FLUOROSCOPY TIME:  Fluoroscopy Time:  1 minutes 42 seconds
[place]

[Series 1: unknown protocol · 0.20mm/px · 4 of 4 slices shown]
[im 1/4]
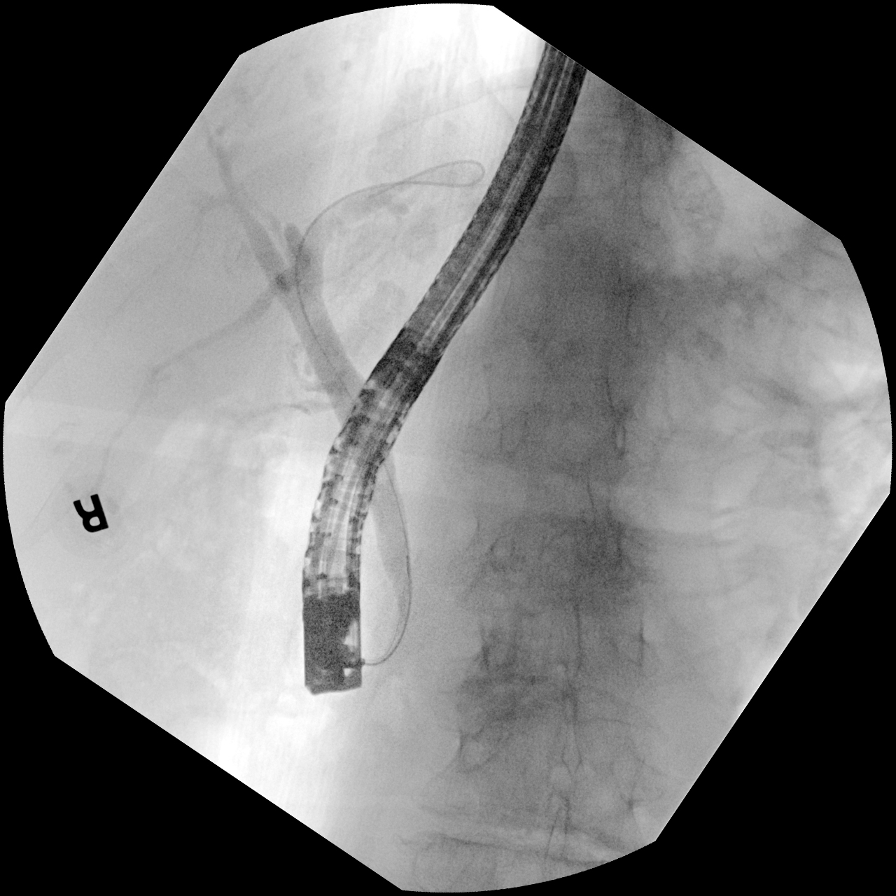
[im 2/4]
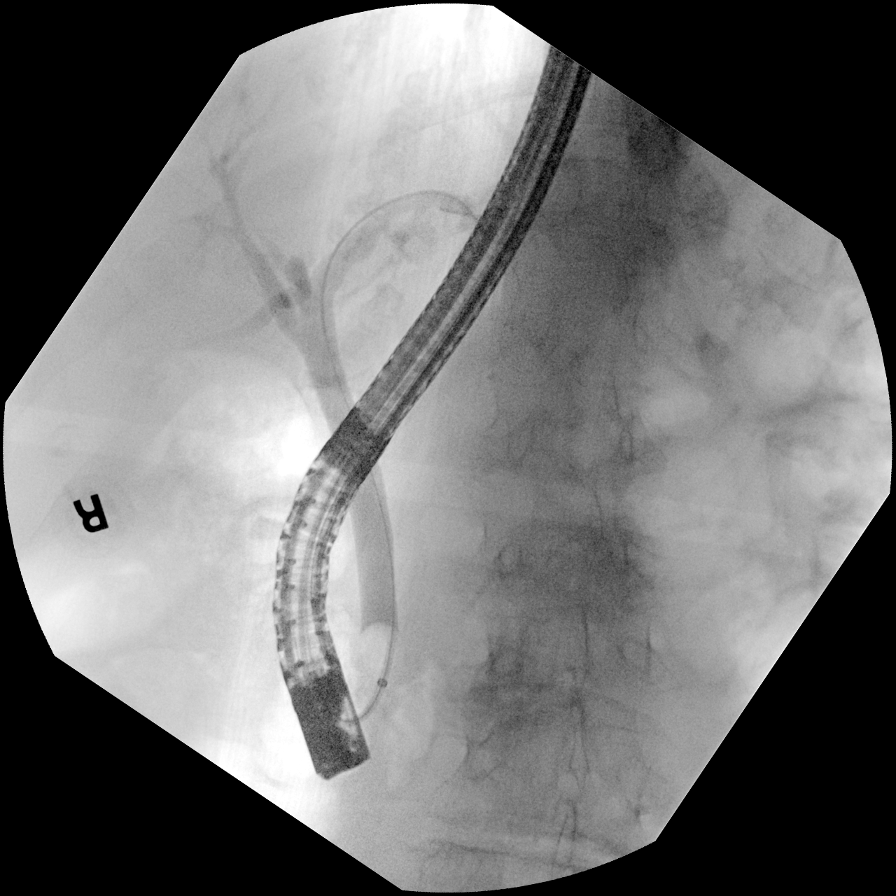
[im 3/4]
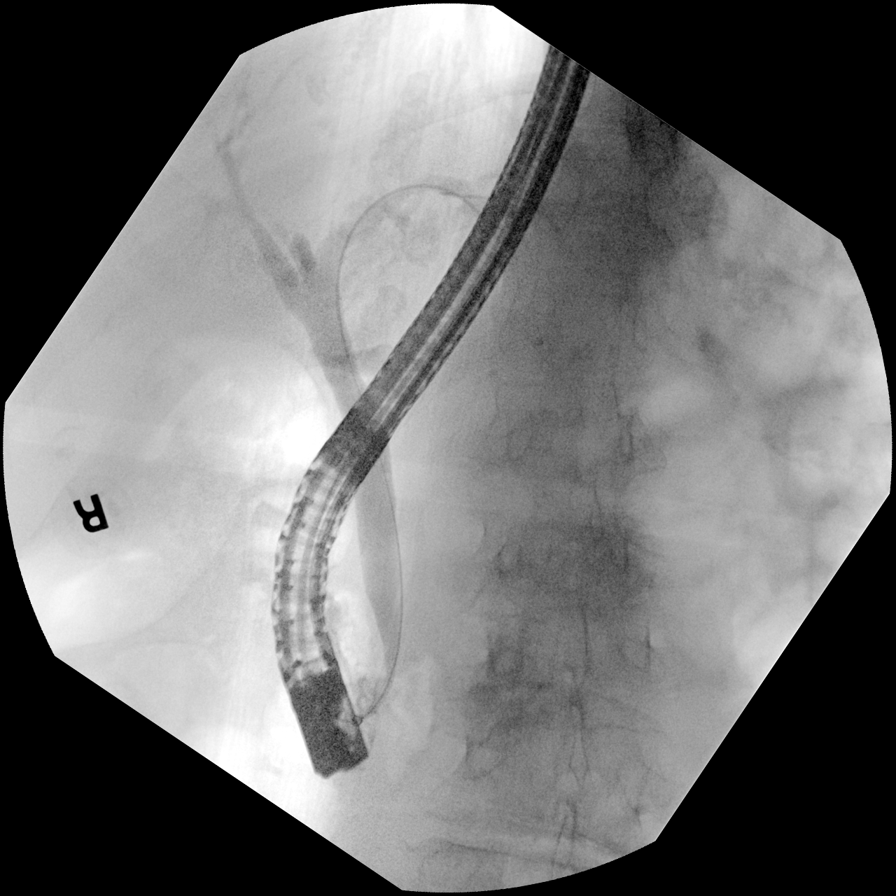
[im 4/4]
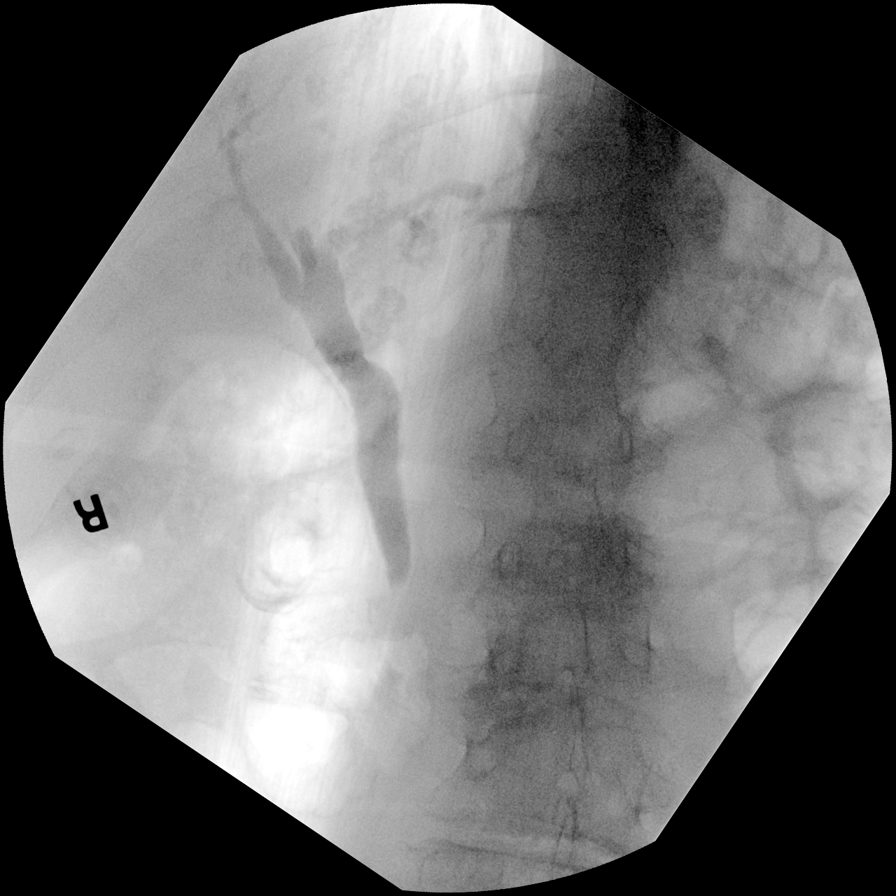

[4 of 4 positions shown; findings below may reference images not displayed]

FINDINGS: Limited intraoperative fluoroscopic images during ERCP.

Initial image demonstrates the endoscope projecting over the upper
abdomen with a safety wire in position in the extrahepatic biliary
ducts and partial opacification with contrast.

Subsequently there is deployment of a retrieval balloon.

Final image demonstrates no definite filling defects.
IMPRESSION: Limited images during ERCP demonstrates treatment of the
extrahepatic biliary ducts with deployment of balloon retrieval
catheter. Please refer to the dictated operative report for full
details of intraoperative findings and procedure.
